# Patient Record
Sex: Female | Born: 1975 | Race: Black or African American | Hispanic: No | Marital: Single | Smoking: Former smoker
Health system: Southern US, Community
[De-identification: ages and names within clinical notes are randomized; demographics above are authoritative.]

## PROBLEM LIST (undated history)

## (undated) DIAGNOSIS — I10 Essential (primary) hypertension: Secondary | ICD-10-CM

## (undated) DIAGNOSIS — F32A Depression, unspecified: Secondary | ICD-10-CM

## (undated) DIAGNOSIS — F329 Major depressive disorder, single episode, unspecified: Secondary | ICD-10-CM

## (undated) DIAGNOSIS — F419 Anxiety disorder, unspecified: Secondary | ICD-10-CM

---

## 2007-11-24 HISTORY — PX: UTERINE FIBROID SURGERY: SHX826

## 2010-07-30 ENCOUNTER — Ambulatory Visit: Payer: Self-pay | Admitting: Psychiatry

## 2010-07-30 ENCOUNTER — Inpatient Hospital Stay (HOSPITAL_COMMUNITY): Admission: RE | Admit: 2010-07-30 | Discharge: 2010-08-03 | Payer: Self-pay | Admitting: Psychiatry

## 2011-02-05 LAB — COMPREHENSIVE METABOLIC PANEL
ALT: 15 U/L (ref 0–35)
Alkaline Phosphatase: 44 U/L (ref 39–117)
CO2: 24 mEq/L (ref 19–32)
GFR calc non Af Amer: >60 mL/min (ref >60)
Glucose, Bld: 91 mg/dL (ref 70–99)
Potassium: 4 mEq/L (ref 3.5–5.1)
Sodium: 139 mEq/L (ref 135–145)

## 2011-02-05 LAB — CBC
HCT: 43.2 % (ref 36.0–46.0)
Hemoglobin: 14.8 g/dL (ref 12.0–15.0)
RBC: 4.67 MIL/uL (ref 3.87–5.11)
WBC: 7.5 10*3/uL (ref 4.0–10.5)

## 2011-02-05 LAB — TSH: TSH: 1.762 u[IU]/mL (ref 0.350–4.500)

## 2011-05-28 ENCOUNTER — Emergency Department (HOSPITAL_COMMUNITY)
Admission: EM | Admit: 2011-05-28 | Discharge: 2011-05-29 | Discharge status: Against Medical Advice | Payer: Self-pay | Attending: Emergency Medicine | Admitting: Emergency Medicine

## 2011-05-28 DIAGNOSIS — R030 Elevated blood-pressure reading, without diagnosis of hypertension: Secondary | ICD-10-CM | POA: Insufficient documentation

## 2011-06-15 ENCOUNTER — Inpatient Hospital Stay (HOSPITAL_COMMUNITY)
Admission: RE | Admit: 2011-06-15 | Discharge: 2011-06-19 | DRG: 885 | Disposition: A | Discharge status: Home / Self Care | Payer: PRIVATE HEALTH INSURANCE | Attending: Psychiatry | Admitting: Psychiatry

## 2011-06-15 DIAGNOSIS — R45851 Suicidal ideations: Secondary | ICD-10-CM

## 2011-06-15 DIAGNOSIS — Z880 Allergy status to penicillin: Secondary | ICD-10-CM

## 2011-06-15 DIAGNOSIS — F3189 Other bipolar disorder: Principal | ICD-10-CM

## 2011-06-16 DIAGNOSIS — F3189 Other bipolar disorder: Secondary | ICD-10-CM

## 2011-06-16 LAB — COMPREHENSIVE METABOLIC PANEL
ALT: 19 U/L (ref 0–35)
BUN: 12 mg/dL (ref 6–23)
CO2: 25 mEq/L (ref 19–32)
Calcium: 9.9 mg/dL (ref 8.4–10.5)
GFR calc Af Amer: >60 mL/min (ref >60)
GFR calc non Af Amer: >60 mL/min (ref >60)
Glucose, Bld: 103 mg/dL — ABNORMAL HIGH (ref 70–99)
Sodium: 136 mEq/L (ref 135–145)

## 2011-06-16 LAB — CBC
HCT: 40.1 % (ref 36.0–46.0)
Hemoglobin: 13 g/dL (ref 12.0–15.0)
MCH: 29.2 pg (ref 26.0–34.0)
MCV: 90.1 fL (ref 78.0–100.0)
RBC: 4.45 MIL/uL (ref 3.87–5.11)

## 2011-06-16 LAB — DRUGS OF ABUSE SCREEN W/O ALC, ROUTINE URINE
Barbiturate Quant, Ur: NEGATIVE
Marijuana Metabolite: POSITIVE — AB
Methadone: NEGATIVE

## 2011-06-16 LAB — URINALYSIS, ROUTINE W REFLEX MICROSCOPIC
Bilirubin Urine: NEGATIVE
Protein, ur: NEGATIVE mg/dL
Urobilinogen, UA: 0.2 mg/dL (ref 0.0–1.0)

## 2011-06-16 LAB — LITHIUM LEVEL: Lithium Lvl: 1.04 mEq/L (ref 0.80–1.40)

## 2011-06-16 LAB — TSH: TSH: 4.198 u[IU]/mL (ref 0.350–4.500)

## 2011-06-16 LAB — URINE MICROSCOPIC-ADD ON

## 2011-06-17 LAB — T4, FREE: Free T4: 0.94 ng/dL (ref 0.80–1.80)

## 2011-06-17 LAB — T3, FREE: T3, Free: 2.9 pg/mL (ref 2.3–4.2)

## 2011-06-17 NOTE — Assessment & Plan Note (Signed)
NAME:  Heidi Baker, Heidi Baker             ACCOUNT NO.:  [id number]  MEDICAL RECORD NO.:  [id number]  LOCATION:  0502                          FACILITY:  [facility]  PHYSICIAN:  Franchot Gallo, MD     DATE OF BIRTH:  1976/04/21  DATE OF ADMISSION:  06/15/2011 DATE OF DISCHARGE:                      PSYCHIATRIC ADMISSION ASSESSMENT   This is a 35 year old female that was voluntarily admitted on June 15, 2011.  HISTORY OF PRESENT ILLNESS:  The patient presents with a history of depression and suicidal thoughts.  She reports states that "things have been building up," reporting problems with motivation, having increased anxiety. She is endorsing mood swings, has a past history of auditory  hallucinations but none currently.  She states her family had noticed a change in her behavior.  She has had a varied appetite with no significant weight loss. She had a plan to overdose.  PAST PSYCHIATRIC HISTORY:  The patient was here in September, was diagnosed with bipolar disorder at [facility] at [facility] mental health.  First diagnosed with bipolar in [place] in 2011. Patient's therapist is Bertram Millard.  SOCIAL HISTORY:  The patient is single.  She is in a relationship.  She is from [place], moved here in September.  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  FAMILY HISTORY:  None.  ALCOHOL AND DRUG HISTORY:  Denies any alcohol or substance use.  PRIMARY CARE PROVIDER:  None.  MEDICAL PROBLEMS:  Denies any acute or chronic health issues.  MEDICATIONS:  Has been on lithium carbonate 900 mg at bedtime, Saforis 10 mg at bedtime, and trazodone 200 mg at bedtime.  DRUG ALLERGIES:  AMOXICILLIN with the side effect of a rash.  REVIEW OF SYSTEMS:  CONSTITUTIONAL:  No fever, chills, positive for insomnia.  She wears glasses for both close and distant vision.  Denies any blurred or double vision.  History of headaches. Denies any seizures.  She denies any  nausea, vomiting.  GU:  No dysuria or frequency or hematuria.  No history of constipation.  EXTREMITIES:  No pain, positive for swelling in her feet.  Positive for depression.  Her temperature is 98.4, 74 heart rate, 16 respirations, blood pressure is 121/85.  PAST SURGICAL HISTORY:  Had a myomectomy in 2009.  She fractured her left wrist when she was 35 years old.  PHYSICAL EXAMINATION:  GENERAL APPEARANCE:  This is an overweight but well-nourished female, normally-developed appears in no distress. HEAD, NOSE AND THROAT:  Atraumatic.  EOMs are intact. NECK:  Supple.  Negative lymphadenopathy. CHEST:  Clear, no wheezes or rales. BREASTS:  Deferred. HEART:  Regular rate and rhythm.  No murmurs or gallops. ABDOMEN:  Soft, nontender abdomen.  Nondistended. PELVIC AND GU:  Exams deferred. EXTREMITIES:  Moves all extremities, 5+ against resistance.  No clubbing, no deformities. SKIN:  Warm and dry without rashes or lacerations. NEUROLOGICAL:  Findings are intact.  LABORATORY DATA:  Urinalysis is negative.  Lithium level 1.04, glucose of 103.  CBC within normal limits.  Urine pregnancy test is negative. Her TSH is in process.  MENTAL STATUS EXAM:  The patient was brought into the Interdisciplinary Team fully alert and cooperative, fair eye contact.  She is dressed in her own clothing, casually dressed.  Her speech is clear, normal pace and tone. Her mood is depressed.  She got tearful for a moment.  Thought process are coherent, goal-directed. Answers questions and offers good history of her symptoms. Cognitive function intact.  Her memory appears intact.  Judgment and insight appear to be good.  Axis I:  Bipolar II disorder, depressed. Axis II:  Deferred. Axis III:  No known medical conditions. Axis IV:  Other psychosocial problems were to chronic mental illness. Axis V:  Current is 37.  PLAN:  Continue with the patient's medications, admit for safety and stabilization.  Will  contact family for support.  Patient is in therapy and will continue to benefit from ongoing therapy.  We will also obtain further labs.  Put patient on a antidepressant with her depressive symptoms.     Landry Corporal, N.P.   ______________________________ Franchot Gallo, MD    JO/MEDQ  D:  06/16/2011  T:  06/17/2011  Job:  [id number]  Electronically Signed by Limmie PatriciaP. on 06/17/2011 01:17:22 PM Electronically Signed by Franchot Gallo MD on 06/17/2011 04:41:48 PM

## 2011-06-23 ENCOUNTER — Other Ambulatory Visit (HOSPITAL_COMMUNITY): Payer: Self-pay | Attending: Psychiatry | Admitting: Psychiatry

## 2011-06-23 NOTE — Discharge Summary (Signed)
  NAME:  Heidi Baker, WARDROP             ACCOUNT NO.:  [id number]  MEDICAL RECORD NO.:  [id number]  LOCATION:                                 FACILITY:  PHYSICIAN:  Franchot Gallo, MD     DATE OF BIRTH:  24-Oct-1976  DATE OF ADMISSION:  06/16/2011 DATE OF DISCHARGE:  06/19/2011                              DISCHARGE SUMMARY   REASON FOR ADMISSION:  This is a 35 year old female who was admitted for depression and suicidal thoughts, reported things have been building up, having problems with motivation, increased anxiety, mood swings.  Her appetite has been very varied, reporting no significant weight loss. She did have some suicidal thoughts with a plan to overdose.  FINAL IMPRESSION:  AXIS I:  Bipolar II disorder, depressed. AXIS II:  Deferred. AXIS III: No acute or chronic health issues. AXIS IV: Chronic mental illness. AXIS V: 70.  PERTINENT LABS:  Urinalysis was negative, lithium level 1.04, glucose of 103.  Her urine pregnancy test was negative.  SIGNIFICANT FINDINGS:  The patient was admitted to the adult milieu for safety and stabilization.  We reviewed her labs and obtained a TSH.  The patient was active in attending the groups.  She was able to articulate her needs and formulated discharge plan and goals.  She was reporting good sleep and good appetite, having mild depressive symptoms, rating it a 5-6 on a scale of 1-10.  She was having episodic suicidal thoughts, but no plan or intent.  Denied any homicidal thoughts or psychotic symptoms and showing no medication side effects.  We increased her Prozac to 40 mg to lessen her depressive symptoms.  She was showing no minimal side effects to her medications.  She was doing well, and on the day of discharge, the patient was reporting decreased sleep but preferred to be discharged and had this addressed by her outpatient provider.  She is having a good appetite, having mild depressive symptoms, rating it a 2-3 on a scale of  1-10.  She is adamantly denying any suicidal or homicidal thoughts or auditory hallucinations, showing mild anxiety, rating it a 2 on a scale of 1-10.  DISCHARGE MEDICATIONS: 1. Prozac 20 mg taking 2 daily. 2. Trazodone 100 mg taking 2 q.h.s. for sleep. 3. Lithium carbonate 300 mg taking 3 at bedtime. 4. Multivitamin daily. 5. Saphris 10 mg 1 q.h.s.  FOLLOWUP:  Her follow-up appointment was with [facility] at phone number [phone] and [facility] at phone number [phone] and had appointment at the [facility] outpatient IOP program at phone number [phone].     Landry Corporal, N.P.   ______________________________ Franchot Gallo, MD    JO/MEDQ  D:  06/22/2011  T:  06/22/2011  Job:  [id number]  Electronically Signed by Limmie PatriciaP. on 06/23/2011 11:56:20 AM Electronically Signed by Franchot Gallo MD on 06/23/2011 01:05:10 PM

## 2011-06-24 ENCOUNTER — Other Ambulatory Visit (HOSPITAL_COMMUNITY): Payer: Self-pay | Attending: Psychiatry | Admitting: Psychiatry

## 2011-06-24 DIAGNOSIS — F3189 Other bipolar disorder: Secondary | ICD-10-CM | POA: Insufficient documentation

## 2011-06-24 DIAGNOSIS — I10 Essential (primary) hypertension: Secondary | ICD-10-CM | POA: Insufficient documentation

## 2011-06-24 DIAGNOSIS — E669 Obesity, unspecified: Secondary | ICD-10-CM | POA: Insufficient documentation

## 2011-06-25 ENCOUNTER — Other Ambulatory Visit (HOSPITAL_BASED_OUTPATIENT_CLINIC_OR_DEPARTMENT_OTHER): Payer: Self-pay | Admitting: Psychiatry

## 2011-06-25 DIAGNOSIS — F3189 Other bipolar disorder: Secondary | ICD-10-CM

## 2011-06-26 ENCOUNTER — Other Ambulatory Visit (HOSPITAL_COMMUNITY): Payer: Self-pay | Admitting: Psychiatry

## 2011-06-29 ENCOUNTER — Other Ambulatory Visit (HOSPITAL_COMMUNITY): Payer: Self-pay | Admitting: Psychiatry

## 2011-06-30 ENCOUNTER — Other Ambulatory Visit (HOSPITAL_COMMUNITY): Payer: Self-pay | Admitting: Psychiatry

## 2011-07-01 ENCOUNTER — Other Ambulatory Visit (HOSPITAL_COMMUNITY): Payer: Self-pay | Admitting: Psychiatry

## 2011-07-02 ENCOUNTER — Other Ambulatory Visit (HOSPITAL_COMMUNITY): Payer: Self-pay | Admitting: Psychiatry

## 2011-07-03 ENCOUNTER — Other Ambulatory Visit (HOSPITAL_COMMUNITY): Payer: Self-pay | Admitting: Psychiatry

## 2011-07-06 ENCOUNTER — Other Ambulatory Visit (HOSPITAL_COMMUNITY): Payer: Self-pay | Admitting: Psychiatry

## 2011-07-07 ENCOUNTER — Other Ambulatory Visit (HOSPITAL_COMMUNITY): Payer: Self-pay | Admitting: Psychiatry

## 2011-07-08 ENCOUNTER — Other Ambulatory Visit (HOSPITAL_COMMUNITY): Payer: Self-pay | Admitting: Psychiatry

## 2011-07-09 ENCOUNTER — Other Ambulatory Visit (HOSPITAL_COMMUNITY): Payer: Self-pay | Admitting: Psychiatry

## 2011-07-10 ENCOUNTER — Other Ambulatory Visit (HOSPITAL_COMMUNITY): Payer: Self-pay | Admitting: Psychiatry

## 2011-07-13 ENCOUNTER — Other Ambulatory Visit (HOSPITAL_COMMUNITY): Payer: Self-pay | Admitting: Psychiatry

## 2011-07-14 ENCOUNTER — Other Ambulatory Visit (HOSPITAL_COMMUNITY): Payer: Self-pay | Admitting: Psychiatry

## 2011-07-15 ENCOUNTER — Other Ambulatory Visit (HOSPITAL_COMMUNITY): Payer: Self-pay | Admitting: Psychiatry

## 2011-07-16 ENCOUNTER — Other Ambulatory Visit (HOSPITAL_COMMUNITY): Payer: Self-pay | Admitting: Psychiatry

## 2011-07-17 ENCOUNTER — Other Ambulatory Visit (HOSPITAL_COMMUNITY): Payer: PRIVATE HEALTH INSURANCE | Admitting: Psychiatry

## 2011-07-17 ENCOUNTER — Other Ambulatory Visit (HOSPITAL_COMMUNITY): Payer: Self-pay | Admitting: Psychiatry

## 2011-07-17 ENCOUNTER — Ambulatory Visit (HOSPITAL_COMMUNITY)
Admission: RE | Admit: 2011-07-17 | Discharge: 2011-07-17 | Disposition: A | Discharge status: Home / Self Care | Payer: Self-pay | Attending: Psychiatry | Admitting: Psychiatry

## 2011-07-17 DIAGNOSIS — F1220 Cannabis dependence, uncomplicated: Secondary | ICD-10-CM | POA: Insufficient documentation

## 2011-07-20 ENCOUNTER — Other Ambulatory Visit (HOSPITAL_COMMUNITY): Payer: PRIVATE HEALTH INSURANCE | Attending: Psychiatry | Admitting: Psychiatry

## 2011-07-20 ENCOUNTER — Other Ambulatory Visit (HOSPITAL_COMMUNITY): Payer: Self-pay | Admitting: Psychiatry

## 2011-07-20 DIAGNOSIS — E669 Obesity, unspecified: Secondary | ICD-10-CM | POA: Insufficient documentation

## 2011-07-20 DIAGNOSIS — I10 Essential (primary) hypertension: Secondary | ICD-10-CM | POA: Insufficient documentation

## 2011-07-20 DIAGNOSIS — F3189 Other bipolar disorder: Secondary | ICD-10-CM | POA: Insufficient documentation

## 2011-07-20 DIAGNOSIS — R45851 Suicidal ideations: Secondary | ICD-10-CM | POA: Insufficient documentation

## 2011-07-22 ENCOUNTER — Other Ambulatory Visit (HOSPITAL_COMMUNITY): Payer: PRIVATE HEALTH INSURANCE | Admitting: Psychiatry

## 2011-07-24 ENCOUNTER — Other Ambulatory Visit (HOSPITAL_COMMUNITY): Payer: Self-pay | Admitting: Psychiatry

## 2011-07-24 ENCOUNTER — Other Ambulatory Visit (HOSPITAL_COMMUNITY): Payer: PRIVATE HEALTH INSURANCE | Admitting: Psychiatry

## 2011-07-25 LAB — URINE DRUGS OF ABUSE SCREEN W ALC, ROUTINE (REF LAB)
Barbiturate Quant, Ur: NEGATIVE
Benzodiazepines.: NEGATIVE
Ethyl Alcohol: <10 mg/dL (ref <10)
Opiate Screen, Urine: NEGATIVE
Phencyclidine (PCP): NEGATIVE
Propoxyphene: NEGATIVE

## 2011-07-28 ENCOUNTER — Other Ambulatory Visit (HOSPITAL_COMMUNITY): Payer: PRIVATE HEALTH INSURANCE | Attending: Physician Assistant

## 2011-07-28 DIAGNOSIS — F3189 Other bipolar disorder: Secondary | ICD-10-CM | POA: Insufficient documentation

## 2011-07-28 DIAGNOSIS — I10 Essential (primary) hypertension: Secondary | ICD-10-CM | POA: Insufficient documentation

## 2011-07-28 DIAGNOSIS — E669 Obesity, unspecified: Secondary | ICD-10-CM | POA: Insufficient documentation

## 2011-07-28 DIAGNOSIS — F1220 Cannabis dependence, uncomplicated: Secondary | ICD-10-CM | POA: Insufficient documentation

## 2011-07-29 ENCOUNTER — Other Ambulatory Visit (HOSPITAL_COMMUNITY): Payer: Self-pay | Admitting: Psychiatry

## 2011-07-29 ENCOUNTER — Other Ambulatory Visit (HOSPITAL_COMMUNITY): Payer: PRIVATE HEALTH INSURANCE | Attending: Psychiatry | Admitting: Psychiatry

## 2011-07-29 DIAGNOSIS — R45851 Suicidal ideations: Secondary | ICD-10-CM | POA: Insufficient documentation

## 2011-07-29 DIAGNOSIS — I10 Essential (primary) hypertension: Secondary | ICD-10-CM | POA: Insufficient documentation

## 2011-07-29 DIAGNOSIS — E669 Obesity, unspecified: Secondary | ICD-10-CM | POA: Insufficient documentation

## 2011-07-29 DIAGNOSIS — F3189 Other bipolar disorder: Secondary | ICD-10-CM | POA: Insufficient documentation

## 2011-07-30 LAB — URINE DRUGS OF ABUSE SCREEN W ALC, ROUTINE (REF LAB)
Barbiturate Quant, Ur: NEGATIVE
Benzodiazepines.: NEGATIVE
Ethyl Alcohol: <10 mg/dL (ref <10)
Phencyclidine (PCP): NEGATIVE
Propoxyphene: NEGATIVE

## 2011-07-31 ENCOUNTER — Other Ambulatory Visit (HOSPITAL_COMMUNITY): Payer: PRIVATE HEALTH INSURANCE | Admitting: Psychiatry

## 2011-08-03 ENCOUNTER — Other Ambulatory Visit (HOSPITAL_COMMUNITY): Payer: PRIVATE HEALTH INSURANCE | Admitting: Psychiatry

## 2011-08-04 ENCOUNTER — Other Ambulatory Visit (HOSPITAL_COMMUNITY): Payer: PRIVATE HEALTH INSURANCE

## 2011-08-05 ENCOUNTER — Other Ambulatory Visit (HOSPITAL_COMMUNITY): Payer: PRIVATE HEALTH INSURANCE | Admitting: Psychiatry

## 2011-08-07 ENCOUNTER — Other Ambulatory Visit (HOSPITAL_COMMUNITY): Payer: PRIVATE HEALTH INSURANCE | Admitting: Psychiatry

## 2011-08-10 ENCOUNTER — Other Ambulatory Visit (HOSPITAL_COMMUNITY): Payer: PRIVATE HEALTH INSURANCE | Admitting: Psychiatry

## 2011-08-12 ENCOUNTER — Other Ambulatory Visit (HOSPITAL_COMMUNITY): Payer: PRIVATE HEALTH INSURANCE | Admitting: Psychiatry

## 2011-08-12 ENCOUNTER — Other Ambulatory Visit (HOSPITAL_COMMUNITY): Payer: Self-pay | Admitting: Psychiatry

## 2011-08-13 LAB — URINE DRUGS OF ABUSE SCREEN W ALC, ROUTINE (REF LAB)
Barbiturate Quant, Ur: NEGATIVE
Benzodiazepines.: NEGATIVE
Marijuana Metabolite: NEGATIVE
Methadone: NEGATIVE
Opiate Screen, Urine: NEGATIVE
Propoxyphene: NEGATIVE

## 2011-08-14 ENCOUNTER — Other Ambulatory Visit (HOSPITAL_COMMUNITY): Payer: PRIVATE HEALTH INSURANCE | Admitting: Psychiatry

## 2011-08-17 ENCOUNTER — Other Ambulatory Visit (HOSPITAL_COMMUNITY): Payer: PRIVATE HEALTH INSURANCE | Admitting: Psychiatry

## 2011-08-19 ENCOUNTER — Encounter (INDEPENDENT_AMBULATORY_CARE_PROVIDER_SITE_OTHER): Payer: PRIVATE HEALTH INSURANCE | Admitting: Marriage and Family Therapist

## 2011-08-19 ENCOUNTER — Other Ambulatory Visit (HOSPITAL_COMMUNITY): Payer: PRIVATE HEALTH INSURANCE | Admitting: Psychiatry

## 2011-08-19 DIAGNOSIS — F4310 Post-traumatic stress disorder, unspecified: Secondary | ICD-10-CM

## 2011-08-19 DIAGNOSIS — F3160 Bipolar disorder, current episode mixed, unspecified: Secondary | ICD-10-CM

## 2011-08-21 ENCOUNTER — Other Ambulatory Visit (HOSPITAL_COMMUNITY): Payer: PRIVATE HEALTH INSURANCE | Admitting: Psychiatry

## 2011-08-24 ENCOUNTER — Other Ambulatory Visit (HOSPITAL_COMMUNITY): Payer: PRIVATE HEALTH INSURANCE | Admitting: Psychiatry

## 2011-08-25 ENCOUNTER — Encounter (INDEPENDENT_AMBULATORY_CARE_PROVIDER_SITE_OTHER): Payer: PRIVATE HEALTH INSURANCE | Admitting: Marriage and Family Therapist

## 2011-08-25 DIAGNOSIS — F3160 Bipolar disorder, current episode mixed, unspecified: Secondary | ICD-10-CM

## 2011-08-25 DIAGNOSIS — F4310 Post-traumatic stress disorder, unspecified: Secondary | ICD-10-CM

## 2011-08-26 ENCOUNTER — Other Ambulatory Visit (HOSPITAL_COMMUNITY): Payer: PRIVATE HEALTH INSURANCE | Admitting: Psychiatry

## 2011-08-28 ENCOUNTER — Other Ambulatory Visit (HOSPITAL_COMMUNITY): Payer: PRIVATE HEALTH INSURANCE | Admitting: Psychiatry

## 2011-08-31 ENCOUNTER — Other Ambulatory Visit (HOSPITAL_COMMUNITY): Payer: PRIVATE HEALTH INSURANCE | Admitting: Psychiatry

## 2011-08-31 ENCOUNTER — Encounter (INDEPENDENT_AMBULATORY_CARE_PROVIDER_SITE_OTHER): Payer: PRIVATE HEALTH INSURANCE | Admitting: Marriage and Family Therapist

## 2011-08-31 DIAGNOSIS — F4310 Post-traumatic stress disorder, unspecified: Secondary | ICD-10-CM

## 2011-08-31 DIAGNOSIS — F3160 Bipolar disorder, current episode mixed, unspecified: Secondary | ICD-10-CM

## 2011-09-02 ENCOUNTER — Other Ambulatory Visit (HOSPITAL_COMMUNITY): Payer: PRIVATE HEALTH INSURANCE | Admitting: Psychiatry

## 2011-09-04 ENCOUNTER — Other Ambulatory Visit (HOSPITAL_COMMUNITY): Payer: PRIVATE HEALTH INSURANCE | Admitting: Psychiatry

## 2011-09-07 ENCOUNTER — Other Ambulatory Visit (HOSPITAL_COMMUNITY): Payer: PRIVATE HEALTH INSURANCE | Admitting: Psychiatry

## 2011-09-08 ENCOUNTER — Encounter (HOSPITAL_COMMUNITY): Payer: PRIVATE HEALTH INSURANCE | Admitting: Marriage and Family Therapist

## 2011-09-09 ENCOUNTER — Other Ambulatory Visit (HOSPITAL_COMMUNITY): Payer: PRIVATE HEALTH INSURANCE | Admitting: Psychiatry

## 2011-09-11 ENCOUNTER — Other Ambulatory Visit (HOSPITAL_COMMUNITY): Payer: PRIVATE HEALTH INSURANCE | Admitting: Psychiatry

## 2011-09-14 ENCOUNTER — Other Ambulatory Visit (HOSPITAL_COMMUNITY): Payer: PRIVATE HEALTH INSURANCE | Admitting: Psychiatry

## 2011-09-15 ENCOUNTER — Encounter (HOSPITAL_COMMUNITY): Payer: PRIVATE HEALTH INSURANCE | Admitting: Marriage and Family Therapist

## 2011-09-15 ENCOUNTER — Encounter (INDEPENDENT_AMBULATORY_CARE_PROVIDER_SITE_OTHER): Payer: PRIVATE HEALTH INSURANCE | Admitting: Marriage and Family Therapist

## 2011-09-15 DIAGNOSIS — F3160 Bipolar disorder, current episode mixed, unspecified: Secondary | ICD-10-CM

## 2011-09-15 DIAGNOSIS — F4310 Post-traumatic stress disorder, unspecified: Secondary | ICD-10-CM

## 2011-09-16 ENCOUNTER — Inpatient Hospital Stay (INDEPENDENT_AMBULATORY_CARE_PROVIDER_SITE_OTHER)
Admission: RE | Admit: 2011-09-16 | Discharge: 2011-09-16 | Disposition: A | Discharge status: Home / Self Care | Payer: PRIVATE HEALTH INSURANCE | Source: Ambulatory Visit | Attending: Emergency Medicine | Admitting: Emergency Medicine

## 2011-09-16 ENCOUNTER — Other Ambulatory Visit (HOSPITAL_COMMUNITY): Payer: PRIVATE HEALTH INSURANCE | Admitting: Psychiatry

## 2011-09-16 DIAGNOSIS — N949 Unspecified condition associated with female genital organs and menstrual cycle: Secondary | ICD-10-CM

## 2011-09-16 LAB — POCT URINALYSIS DIP (DEVICE)
Bilirubin Urine: NEGATIVE
Glucose, UA: NEGATIVE mg/dL
Hgb urine dipstick: NEGATIVE
Ketones, ur: NEGATIVE mg/dL
Specific Gravity, Urine: 1.02 (ref 1.005–1.030)
pH: 7.5 (ref 5.0–8.0)

## 2011-09-16 LAB — WET PREP, GENITAL
Trich, Wet Prep: NONE SEEN
Yeast Wet Prep HPF POC: NONE SEEN

## 2011-09-17 LAB — GC/CHLAMYDIA PROBE AMP, GENITAL
Chlamydia, DNA Probe: NEGATIVE
GC Probe Amp, Genital: NEGATIVE

## 2011-09-18 ENCOUNTER — Other Ambulatory Visit (HOSPITAL_COMMUNITY): Payer: PRIVATE HEALTH INSURANCE | Admitting: Psychiatry

## 2011-09-21 ENCOUNTER — Other Ambulatory Visit (HOSPITAL_COMMUNITY): Payer: PRIVATE HEALTH INSURANCE | Admitting: Psychiatry

## 2011-09-22 ENCOUNTER — Encounter (INDEPENDENT_AMBULATORY_CARE_PROVIDER_SITE_OTHER): Payer: PRIVATE HEALTH INSURANCE | Admitting: Marriage and Family Therapist

## 2011-09-22 DIAGNOSIS — F4310 Post-traumatic stress disorder, unspecified: Secondary | ICD-10-CM

## 2011-09-22 DIAGNOSIS — F3160 Bipolar disorder, current episode mixed, unspecified: Secondary | ICD-10-CM

## 2011-09-23 ENCOUNTER — Other Ambulatory Visit (HOSPITAL_COMMUNITY): Payer: PRIVATE HEALTH INSURANCE | Admitting: Psychiatry

## 2011-09-29 ENCOUNTER — Encounter (HOSPITAL_COMMUNITY): Payer: PRIVATE HEALTH INSURANCE | Admitting: Marriage and Family Therapist

## 2011-10-07 ENCOUNTER — Telehealth (HOSPITAL_COMMUNITY): Payer: Self-pay | Admitting: Marriage and Family Therapist

## 2011-10-07 ENCOUNTER — Ambulatory Visit (INDEPENDENT_AMBULATORY_CARE_PROVIDER_SITE_OTHER): Payer: PRIVATE HEALTH INSURANCE | Admitting: Marriage and Family Therapist

## 2011-10-07 DIAGNOSIS — F3289 Other specified depressive episodes: Secondary | ICD-10-CM

## 2011-10-07 DIAGNOSIS — F329 Major depressive disorder, single episode, unspecified: Secondary | ICD-10-CM
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
## 2011-10-13 ENCOUNTER — Encounter (HOSPITAL_COMMUNITY): Payer: PRIVATE HEALTH INSURANCE | Admitting: Marriage and Family Therapist

## 2011-10-20 ENCOUNTER — Ambulatory Visit (HOSPITAL_COMMUNITY): Payer: PRIVATE HEALTH INSURANCE | Admitting: Marriage and Family Therapist

## 2011-10-20 NOTE — Progress Notes (Signed)
   THERAPIST PROGRESS NOTE  Session Time: 8:00 a.m.  Participation Level: Did Not Attend  Behavioral Response:   Type of Therapy: Individual Therapy  Treatment Goals addressed: Coping  Interventions: Other: None  Summary: Heidi Baker is a 36 y.o. female who presents with history of trauma and marijuana abuse.  Patient was asked to obtain a drug screen to ensure she is clean of marijuana.  She did not show for her appointment today stating she forgot she had an appointment.  She also reports she did not get her drug screen from several weeks ago.  Told patient that unless she gets the drug screen she is not allowed to attend sessions.  Patient agreed.  Suicidal/Homicidal: Nowithout intent/plan  Therapist Response: Patient can attend sessions when she completes her drug screen.  Plan: Return again in 2 weeks.  Diagnosis: Axis I: PTSD; marijuana dependence    Axis II: Deferred    Emberley Kral, LMFT 10/20/2011

## 2011-10-27 ENCOUNTER — Ambulatory Visit (HOSPITAL_COMMUNITY): Payer: PRIVATE HEALTH INSURANCE | Admitting: Marriage and Family Therapist

## 2011-11-10 ENCOUNTER — Ambulatory Visit (HOSPITAL_COMMUNITY): Payer: PRIVATE HEALTH INSURANCE | Admitting: Marriage and Family Therapist

## 2011-12-07 ENCOUNTER — Ambulatory Visit (HOSPITAL_COMMUNITY): Payer: PRIVATE HEALTH INSURANCE | Admitting: Marriage and Family Therapist

## 2011-12-14 ENCOUNTER — Ambulatory Visit (HOSPITAL_COMMUNITY): Payer: PRIVATE HEALTH INSURANCE | Admitting: Marriage and Family Therapist

## 2012-12-13 NOTE — Telephone Encounter (Signed)
No contact.

## 2013-06-09 ENCOUNTER — Other Ambulatory Visit (HOSPITAL_COMMUNITY): Payer: Self-pay | Admitting: Obstetrics & Gynecology

## 2013-06-09 DIAGNOSIS — IMO0002 Reserved for concepts with insufficient information to code with codable children: Secondary | ICD-10-CM

## 2013-06-16 ENCOUNTER — Ambulatory Visit (HOSPITAL_COMMUNITY)
Admission: RE | Admit: 2013-06-16 | Discharge: 2013-06-16 | Disposition: A | Discharge status: Home / Self Care | Payer: BC Managed Care – PPO | Source: Ambulatory Visit | Attending: Obstetrics & Gynecology | Admitting: Obstetrics & Gynecology

## 2013-06-16 DIAGNOSIS — N979 Female infertility, unspecified: Secondary | ICD-10-CM | POA: Insufficient documentation

## 2013-06-16 DIAGNOSIS — IMO0002 Reserved for concepts with insufficient information to code with codable children: Secondary | ICD-10-CM

## 2013-06-16 MED ORDER — IOHEXOL 300 MG/ML  SOLN
20.0000 mL | Freq: Once | INTRAMUSCULAR | Status: AC | PRN
  Administered 2013-06-16: 4 mL

## 2013-11-05 ENCOUNTER — Emergency Department (HOSPITAL_COMMUNITY)
Admission: EM | Admit: 2013-11-05 | Discharge: 2013-11-06 | Disposition: A | Discharge status: Other Acute Inpatient Hospital | Payer: BC Managed Care – PPO | Attending: Emergency Medicine | Admitting: Emergency Medicine

## 2013-11-05 DIAGNOSIS — Z3202 Encounter for pregnancy test, result negative: Secondary | ICD-10-CM | POA: Insufficient documentation

## 2013-11-05 DIAGNOSIS — F3289 Other specified depressive episodes: Secondary | ICD-10-CM | POA: Insufficient documentation

## 2013-11-05 DIAGNOSIS — F411 Generalized anxiety disorder: Secondary | ICD-10-CM | POA: Insufficient documentation

## 2013-11-05 DIAGNOSIS — F329 Major depressive disorder, single episode, unspecified: Secondary | ICD-10-CM | POA: Insufficient documentation

## 2013-11-05 DIAGNOSIS — Z0289 Encounter for other administrative examinations: Secondary | ICD-10-CM | POA: Insufficient documentation

## 2013-11-05 DIAGNOSIS — Z79899 Other long term (current) drug therapy: Secondary | ICD-10-CM | POA: Insufficient documentation

## 2013-11-05 DIAGNOSIS — R45851 Suicidal ideations: Secondary | ICD-10-CM

## 2013-11-05 DIAGNOSIS — Z87891 Personal history of nicotine dependence: Secondary | ICD-10-CM | POA: Insufficient documentation

## 2013-11-05 LAB — COMPREHENSIVE METABOLIC PANEL
ALT: 11 U/L (ref 0–35)
AST: 17 U/L (ref 0–37)
CO2: 23 mEq/L (ref 19–32)
Calcium: 9.3 mg/dL (ref 8.4–10.5)
Creatinine, Ser: 0.94 mg/dL (ref 0.50–1.10)
GFR calc Af Amer: >90 mL/min (ref >90)
GFR calc non Af Amer: 78 mL/min — ABNORMAL LOW (ref >90)
Sodium: 136 mEq/L (ref 135–145)
Total Protein: 6.8 g/dL (ref 6.0–8.3)

## 2013-11-05 LAB — CBC
MCH: 23.1 pg — ABNORMAL LOW (ref 26.0–34.0)
MCHC: 30.9 g/dL (ref 30.0–36.0)
MCV: 74.9 fL — ABNORMAL LOW (ref 78.0–100.0)
Platelets: 251 10*3/uL (ref 150–400)

## 2013-11-05 LAB — SALICYLATE LEVEL: Salicylate Lvl: <2 mg/dL — ABNORMAL LOW (ref 2.8–20.0)

## 2013-11-05 MED ORDER — LORAZEPAM 1 MG PO TABS: 1.0000 mg | ORAL_TABLET | Freq: Three times a day (TID) | ORAL | Status: DC | PRN

## 2013-11-05 MED ORDER — LITHIUM CARBONATE 300 MG PO CAPS
300.0000 mg | ORAL_CAPSULE | Freq: Three times a day (TID) | ORAL | Status: DC
  Administered 2013-11-06: 300 mg via ORAL
  Filled 2013-11-05: qty 1

## 2013-11-05 MED ORDER — ONDANSETRON HCL 4 MG PO TABS: 4.0000 mg | ORAL_TABLET | Freq: Three times a day (TID) | ORAL | Status: DC | PRN

## 2013-11-05 MED ORDER — ZIPRASIDONE MESYLATE 20 MG IM SOLR
INTRAMUSCULAR | Status: AC
  Filled 2013-11-05: qty 20

## 2013-11-05 MED ORDER — ZIPRASIDONE MESYLATE 20 MG IM SOLR
20.0000 mg | Freq: Once | INTRAMUSCULAR | Status: AC
  Administered 2013-11-05: 20 mg via INTRAMUSCULAR

## 2013-11-05 MED ORDER — ZOLPIDEM TARTRATE 5 MG PO TABS: 5.0000 mg | ORAL_TABLET | Freq: Every evening | ORAL | Status: DC | PRN

## 2013-11-05 MED ORDER — ACETAMINOPHEN 325 MG PO TABS: 650.0000 mg | ORAL_TABLET | ORAL | Status: DC | PRN

## 2013-11-05 NOTE — ED Notes (Signed)
Pt became very agitated, screaming and yelling,  Pt states if a gun was held to her head she would be fine and feel better,  Pt is refusing all care at this time,  Pt states she is going to lobby,  Pt has on blue scrubs but grabs her clothes out of the bag and them becomes more enraged when I tell her she cannot go to lobby,  Pt is pacing beating fist,  Glaring at me,  Security and [employer]  At bedside with assistance for medication injection.

## 2013-11-05 NOTE — ED Provider Notes (Signed)
CSN: [id number]     Arrival date & time 11/05/13  1916 History  This chart was scribed for Earley Favor, NP, working with Flint Melter, MD by Blanchard Kelch, ED Scribe. This patient was seen in room WTR4/[facility]PT4 and the patient's care was started at 8:34 PM.    Chief Complaint  Patient presents with  . Medical Clearance    The history is provided by the patient. No language interpreter was used.    HPI Comments: Heidi Baker is a 37 y.o. female who presents to the Emergency Department for a medical clearance. She has a past medical history of bipolar disorder and PTSD. She states she has had racing thoughts, anger and SI for the past few days. She states that her plan is a gun to her head, but she does not have access to a gun at home. She has been taking Ambien and Melatonin every time she wakes up for the past few days to help dull the thoughts. 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 Her therapist is Mr. Lorin Picket, but she has only seen him once about a week ago. They tried calling him but couldn't get in touch. Last week he told the patient he needed to get an appointment to change her drug therapy, but he can't change it unless he sees her multiple times. She has appointments for every Friday in January. She was last hospitalized for her psychiatric problems about a year ago. She has a job as a Runner, broadcasting/film/video and has been doing well with it for the past year.    No past medical history on file. No past surgical history on file. No family history on file. History  Substance Use Topics  . Smoking status: Not on file  . Smokeless tobacco: Not on file  . Alcohol Use: Not on file   OB History   No data available     Review of Systems  Constitutional: Negative for fever.  Psychiatric/Behavioral: Positive for suicidal ideas and dysphoric mood. The patient is nervous/anxious.   All other systems reviewed and are negative.    Allergies   Amoxicillin  Home Medications   Current Outpatient Rx  Name  Route  Sig  Dispense  Refill  . lithium carbonate 300 MG capsule   Oral   Take 300 mg by mouth 3 (three) times daily with meals.         . Melatonin 1 MG TABS   Oral   Take 1 tablet by mouth daily.         Marland Kitchen zolpidem (AMBIEN) 10 MG tablet   Oral   Take 10 mg by mouth at bedtime as needed for sleep.          Triage Vitals: BP 121/75  Pulse 75  Temp(Src) 98.3 F (36.8 C) (Oral)  Resp 15  Ht 5\' 6"  (1.676 m)  Wt 193 lb (87.544 kg)  BMI 31.17 kg/m2  SpO2 99%  LMP 10/23/2013  Physical Exam  Nursing note and vitals reviewed. Constitutional: She is oriented to person, place, and time. She appears well-developed and well-nourished. No distress.  HENT:  Head: Normocephalic and atraumatic.  Eyes: EOM are normal.  Neck: Normal range of motion. Neck supple.  Cardiovascular: Normal rate and regular rhythm.   Pulmonary/Chest: Effort normal. No respiratory distress.  Musculoskeletal: Normal range of motion.  Neurological: She is alert and oriented to person, place, and time.  Skin: Skin is warm and dry.  Psychiatric: Her speech is normal and behavior is normal. Her mood appears anxious.    ED Course  Procedures (including critical care time)  DIAGNOSTIC STUDIES: Oxygen Saturation is 99% on room air, normal by my interpretation.    COORDINATION OF CARE: 8:40 PM -Will order labs. Patient verbalizes understanding and agrees with treatment plan.    Labs Review Labs Reviewed - No data to display Imaging Review No results found.  EKG Interpretation   None       MDM  No diagnosis found. I discussed the evaluation process with the patient and her husband  I personally performed the services described in this documentation, which was scribed in my presence. The recorded information has been reviewed and is accurate.   Arman Filter, NP 11/05/13 [phone]

## 2013-11-05 NOTE — ED Notes (Signed)
Bed: WLPT4 Expected date:  Expected time:  Means of arrival:  Comments: 
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
## 2013-11-05 NOTE — ED Notes (Signed)
Pt arrived with a need for medical clearance.  Pt states she has bi[polar and PTSD and is on  Medication regiment , to which she has been compliant but is still having excessive stress.

## 2013-11-05 NOTE — ED Provider Notes (Signed)
Medical screening examination/treatment/procedure(s) were performed by non-physician practitioner and as supervising physician I was immediately available for consultation/collaboration.  Flint Melter, MD 11/05/13 [phone]

## 2013-11-06 ENCOUNTER — Inpatient Hospital Stay (HOSPITAL_COMMUNITY)
Admission: AD | Admit: 2013-11-06 | Discharge: 2013-11-13 | DRG: 885 | Disposition: A | Discharge status: Home / Self Care | Payer: BC Managed Care – PPO | Source: Intra-hospital | Attending: Psychiatry | Admitting: Psychiatry

## 2013-11-06 ENCOUNTER — Encounter (HOSPITAL_COMMUNITY): Payer: Self-pay | Admitting: *Deleted

## 2013-11-06 DIAGNOSIS — F1210 Cannabis abuse, uncomplicated: Secondary | ICD-10-CM | POA: Diagnosis present

## 2013-11-06 DIAGNOSIS — F4310 Post-traumatic stress disorder, unspecified: Secondary | ICD-10-CM | POA: Diagnosis present

## 2013-11-06 DIAGNOSIS — F1010 Alcohol abuse, uncomplicated: Secondary | ICD-10-CM

## 2013-11-06 DIAGNOSIS — F3110 Bipolar disorder, current episode manic without psychotic features, unspecified: Principal | ICD-10-CM | POA: Diagnosis present

## 2013-11-06 DIAGNOSIS — F411 Generalized anxiety disorder: Secondary | ICD-10-CM | POA: Diagnosis present

## 2013-11-06 DIAGNOSIS — Z79899 Other long term (current) drug therapy: Secondary | ICD-10-CM

## 2013-11-06 DIAGNOSIS — R45851 Suicidal ideations: Secondary | ICD-10-CM

## 2013-11-06 HISTORY — DX: Anxiety disorder, unspecified: F41.9

## 2013-11-06 HISTORY — DX: Depression, unspecified: F32.A

## 2013-11-06 HISTORY — DX: Major depressive disorder, single episode, unspecified: F32.9

## 2013-11-06 LAB — RAPID URINE DRUG SCREEN, HOSP PERFORMED
Amphetamines: NOT DETECTED
Cocaine: NOT DETECTED
Opiates: NOT DETECTED
Tetrahydrocannabinol: POSITIVE — AB

## 2013-11-06 LAB — POCT PREGNANCY, URINE: Preg Test, Ur: NEGATIVE

## 2013-11-06 MED ORDER — ACETAMINOPHEN 325 MG PO TABS
650.0000 mg | ORAL_TABLET | Freq: Four times a day (QID) | ORAL | Status: DC | PRN
  Administered 2013-11-07: 650 mg via ORAL
  Filled 2013-11-06: qty 2

## 2013-11-06 MED ORDER — MAGNESIUM HYDROXIDE 400 MG/5ML PO SUSP: 30.0000 mL | Freq: Every day | ORAL | Status: DC | PRN

## 2013-11-06 MED ORDER — ALUM & MAG HYDROXIDE-SIMETH 200-200-20 MG/5ML PO SUSP: 30.0000 mL | ORAL | Status: DC | PRN

## 2013-11-06 MED ORDER — LITHIUM CARBONATE 300 MG PO CAPS
300.0000 mg | ORAL_CAPSULE | Freq: Three times a day (TID) | ORAL | Status: DC
  Administered 2013-11-06 – 2013-11-08 (×6): 300 mg via ORAL
  Filled 2013-11-06 (×13): qty 1

## 2013-11-06 MED ORDER — TRAZODONE HCL 50 MG PO TABS
50.0000 mg | ORAL_TABLET | Freq: Every evening | ORAL | Status: DC | PRN
  Administered 2013-11-06: 50 mg via ORAL
  Filled 2013-11-06: qty 1

## 2013-11-06 NOTE — Progress Notes (Signed)
Recreation Therapy Notes  Date: 12.15.2014 Time: 3:00pm Location: [address] Dayroom   Group Topic: Communication, Team Building, Problem Solving  Goal Area(s) Addresses:  Patient will effectively work with peer towards shared goal.  Patient will identify skill used to make activity successful.  Patient will identify how skills used during activity can be used to reach post d/c goals.   Behavioral Response: Appropriate   Intervention: Problem Solving Activitiy  Activity: Life Boat. Patients were given a scenario about being on a sinking yacht. Patients were informed the yacht included 15 guest, 8 of which could be placed on the life boat, along with all group members. Individuals on guest list were of varying socioeconomic classes such as a Education officer, museum, Materials engineer, Midwife, Tree surgeon.   Education: Pharmacist, community, Discharge Planning    Education Outcome: Acknowledges understanding  Clinical Observations/Feedback: Patient with peers were slow to engage in activity, needing encouragement to participate in group session. Following encouragement patient actively engaged in group session, voicing her opinion and debating with peers appropriately. Patient made no additional contributions to group discussion, but appeared to actively listen, as she maintained appropriate eye contact with speaker.    Heidi Baker Heidi Baker, LRT/CTRS  Jearl Klinefelter 11/06/2013 3:51 PM

## 2013-11-06 NOTE — Progress Notes (Signed)
Patient ID: Heidi Baker, female   DOB: [birth year], 37 y.o.   MRN: [id number]  Patient less agitated after receiving Geodon injection. Patient has been accepted to [facility], Bed 503-2.
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
## 2013-11-06 NOTE — [facility] Counselor (Signed)
Per [facility] RN, pt can be transferred after 10:30 am.  Evette Cristal, [facility] Assessment Counselor

## 2013-11-06 NOTE — Progress Notes (Signed)
D: Patient in the dayroom on approach.  Patient states she had an ok day today.  Patient states since she has been at [facility] her experience has been better.  Patient states she is concerned about her medications and states she just wants to feel better.  Patient states she has a lot to work on.  Patient denies SI.HI and denies AVH.  A: Staff to monitor Q 15 mins for safety.  Encouragement and support offered.  No scheduled medications administered per orders.  Trazodone administered prn for sleep. R: Patient remains safe on the unit.  Patient attended group tonight.  Patient visible on the unit and interacting with peers.

## 2013-11-06 NOTE — Tx Team (Signed)
Initial Interdisciplinary Treatment Plan  PATIENT STRENGTHS: (choose at least two) Ability for insight Average or above average intelligence Capable of independent living Communication skills General fund of knowledge Motivation for treatment/growth Supportive family/friends Work skills  PATIENT STRESSORS: Marital or family conflict Medication change or noncompliance Occupational concerns Substance abuse   PROBLEM LIST: Problem List/Patient Goals Date to be addressed Date deferred Reason deferred Estimated date of resolution  Substance abuse 11/06/2013   D/c        Suicidal ideation 11/06/2013   D/c        depression 11/06/2013   D/c                           DISCHARGE CRITERIA:  Ability to meet basic life and health needs Adequate post-discharge living arrangements Improved stabilization in mood, thinking, and/or behavior Medical problems require only outpatient monitoring Motivation to continue treatment in a less acute level of care Need for constant or close observation no longer present Reduction of life-threatening or endangering symptoms to within safe limits Safe-care adequate arrangements made Verbal commitment to aftercare and medication compliance Withdrawal symptoms are absent or subacute and managed without 24-hour nursing intervention  PRELIMINARY DISCHARGE PLAN: Attend aftercare/continuing care group Attend PHP/IOP Attend 12-step recovery group Outpatient therapy Participate in family therapy Return to previous living arrangement Return to previous work or school arrangements  PATIENT/FAMIILY INVOLVEMENT: This treatment plan has been presented to and reviewed with the patient, Heidi Baker.  The patient and family have been given the opportunity to ask questions and make suggestions.  Heidi Baker 11/06/2013, 1:43 PM
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
## 2013-11-07 DIAGNOSIS — F1994 Other psychoactive substance use, unspecified with psychoactive substance-induced mood disorder: Secondary | ICD-10-CM

## 2013-11-07 DIAGNOSIS — F1210 Cannabis abuse, uncomplicated: Secondary | ICD-10-CM | POA: Diagnosis present

## 2013-11-07 DIAGNOSIS — F1910 Other psychoactive substance abuse, uncomplicated: Secondary | ICD-10-CM

## 2013-11-07 DIAGNOSIS — F3110 Bipolar disorder, current episode manic without psychotic features, unspecified: Secondary | ICD-10-CM | POA: Diagnosis present

## 2013-11-07 DIAGNOSIS — F3160 Bipolar disorder, current episode mixed, unspecified: Secondary | ICD-10-CM

## 2013-11-07 MED ORDER — TRAZODONE HCL 50 MG PO TABS
50.0000 mg | ORAL_TABLET | Freq: Every evening | ORAL | Status: DC | PRN
  Administered 2013-11-07 – 2013-11-12 (×5): 50 mg via ORAL
  Filled 2013-11-07 (×5): qty 1

## 2013-11-07 MED ORDER — MELATONIN 5 MG PO TABS
5.0000 mg | ORAL_TABLET | Freq: Every day | ORAL | Status: DC
  Administered 2013-11-07 – 2013-11-08 (×2): 5 mg via ORAL

## 2013-11-07 MED ORDER — NON FORMULARY: 100.0000 mg | Freq: Every day | Status: DC

## 2013-11-07 MED ORDER — MELATONIN 1 MG PO TABS
1.0000 mg | ORAL_TABLET | Freq: Every day | ORAL | Status: DC
Start: 2013-11-07 — End: 2013-11-07
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
## 2013-11-07 NOTE — Progress Notes (Signed)
Adult Psychoeducational Group Note  Date:  11/07/2013 Time:  2:32 AM  Group Topic/Focus:  Wrap-Up Group:   The focus of this group is to help patients review their daily goal of treatment and discuss progress on daily workbooks.  Participation Level:  Active  Participation Quality:  Appropriate  Affect:  Appropriate  Cognitive:  Appropriate  Insight: Appropriate  Engagement in Group:  Engaged  Modes of Intervention:  Support  Additional Comments:Pt stated that she had a great group earlier today and that the example, visualization, given was very powerful  Areana Kosanke 11/07/2013, 2:32 AM

## 2013-11-07 NOTE — Progress Notes (Signed)
The focus of this group is to educate the patient on the purpose and policies of crisis stabilization and provide a format to answer questions about their admission.  The group details unit policies and expectations of patients while admitted.  Patient did not attend group. 

## 2013-11-07 NOTE — Progress Notes (Signed)
D: Patient in the dayroom interacting with peers on approach.  Patient states she had a good day today.  Patient states her fiance was able to come visit her several times today.  Patient animated and engages in conversation with Clinical research associate.  Patient states this experience has made her realize she has some things she needs to change.  Patient   Patient denies SI/HI and denies AVH. A: Staff to monitor Q 15 mins for safety.  Encouragement and support offered.  Scheduled medications administered per orders. R: Patient remains safe on the unit.  Patient attended group tonight.  Patient visible on the unit and interacting with peers.  Patient taking administered medications.
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
## 2013-11-07 NOTE — Progress Notes (Signed)
D: Patient denies SI/HI and A/V hallucinations; patient reports sleep is poor; reports appetite is poor; reports energy level is low ; reports ability to pay attention is improving; rates depression as 6-7/10; rates hopelessness 8/10;   A: Monitored q 15 minutes; patient encouraged to attend groups; patient educated about medications; patient given medications per physician orders; patient encouraged to express feelings and/or concerns  R: Patient is flat and blunted; patient does have some insight into her situation; patient will elaborate and appears to be invested; patient's interaction with staff and peers is approrpiate; patient was able to set goal to talk with staff 1:1 when having feelings of SI; patient is taking medications as prescribed and tolerating medications; patient is attending all groups and engaging

## 2013-11-07 NOTE — Progress Notes (Signed)
Adult Psychoeducational Group Note  Date:  11/07/2013 Time:  11:00am  Group Topic/Focus:  Recovery Goals:   The focus of this group is to identify appropriate goals for recovery and establish a plan to achieve them.  Participation Level:  Active  Participation Quality:  Appropriate and Attentive  Affect:  Appropriate  Cognitive:  Alert and Appropriate  Insight: Appropriate  Engagement in Group:  Engaged  Modes of Intervention:  Discussion and Education  Additional Comments:  Pt attended and participated in group. Discussion was on recovery and what it means to you. Recovery means getting better.  Shelly Bombard D 11/07/2013, 5:19 PM

## 2013-11-07 NOTE — [facility] Group Notes (Addendum)
[facility] LCSW Group Therapy      Feelings About Diagnosis 1:15 - 2:30 PM         11/07/2013  3:15 PM   Type of Therapy:  Group Therapy  Participation Level:  Active  Participation Quality:  Appropriate  Affect:  Appropriate  Cognitive:  Alert and Appropriate  Insight:  Developing/Improving and Engaged  Engagement in Therapy:  Developing/Improving and Engaged  Modes of Intervention:  Discussion, Education, Exploration, Problem-Solving, Rapport Building, Support  Summary of Progress/Problems:  Patient actively participated in group. Patient discussed past and present diagnosis and the effects it has had on  life.  Patient talked about family and society being judgmental and the stigma associated with having a mental health diagnosis.  Patient shared she is able to accept her illness and who she is whether depressed or manic.  Patient shared she works to be the best "Cally" she can be.  Wynn Banker 11/07/2013  3:15 PM
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
## 2013-11-07 NOTE — BHH Suicide Risk Assessment (Signed)
[facility] INPATIENT:  Family/Significant Other Suicide Prevention Education  Suicide Prevention Education:  Education Completed; [facility] [place], [place], [phone]; has been identified by the patient as the family member/significant other with whom the patient will be residing, and identified as the person(s) who will aid the patient in the event of a mental health crisis (suicidal ideations/suicide attempt).  With written consent from the patient, the family member/significant other has been provided the following suicide prevention education, prior to the and/or following the discharge of the patient.  The suicide prevention education provided includes the following:  Suicide risk factors  Suicide prevention and interventions  National Suicide Hotline telephone number  [facility] assessment telephone number  [facility] Emergency Assistance 911  [facility] and/or Residential Mobile Crisis Unit telephone number  Request made of family/significant other to:  Remove weapons (e.g., guns, rifles, knives), all items previously/currently identified as safety concern.  Family member to secure guns prior to patient discharging.    Remove drugs/medications (over-the-counter, prescriptions, illicit drugs), all items previously/currently identified as a safety concern.  The family member/significant other verbalizes understanding of the suicide prevention education information provided.  The family member/significant other agrees to remove the items of safety concern listed above.  Wynn Banker 11/07/2013, 11:06 AM

## 2013-11-08 MED ORDER — LITHIUM CARBONATE 300 MG PO CAPS
1200.0000 mg | ORAL_CAPSULE | Freq: Every day | ORAL | Status: DC
  Administered 2013-11-09 – 2013-11-12 (×4): 1200 mg via ORAL
  Filled 2013-11-08 (×6): qty 4

## 2013-11-08 MED ORDER — DESOGESTREL-ETHINYL ESTRADIOL 0.15-30 MG-MCG PO TABS: 1.0000 | ORAL_TABLET | Freq: Every day | ORAL | Status: DC

## 2013-11-08 MED ORDER — LITHIUM CARBONATE 300 MG PO CAPS
900.0000 mg | ORAL_CAPSULE | Freq: Once | ORAL | Status: AC
  Administered 2013-11-08: 900 mg via ORAL
  Filled 2013-11-08 (×2): qty 3

## 2013-11-08 NOTE — Progress Notes (Signed)
Adult Psychoeducational Group Note  Date:  11/08/2013 Time:  11:00am Group Topic/Focus:  Personal Choices and Values:   The focus of this group is to help patients assess and explore the importance of values in their lives, how their values affect their decisions, how they express their values and what opposes their expression.  Participation Level:  Active  Participation Quality:  Appropriate and Attentive  Affect:  Appropriate  Cognitive:  Alert and Appropriate  Insight: Appropriate  Engagement in Group:  Engaged  Modes of Intervention:  Discussion and Education  Additional Comments:  Pt attended and participated in group. Discussion was on personal development. Pt stated personal development means self evaluation and put plans into actions.  Shelly Bombard D 11/08/2013, 12:54 PM

## 2013-11-08 NOTE — Progress Notes (Signed)
Recreation Therapy Notes  Date: 12.17.2014 Time: 3:00pm Location: [address] Dayroom   Group Topic: Communication, Team Building.   Goal Area(s) Addresses:  Patient will effectively work with peer towards shared goal.  Patient will identify skill used to make activity successful.  Patient will identify how skills used during activity can be used to reach post d/c goals.   Behavioral Response: Appropraite  Intervention: Survival Scenario  Activity: Space Survival. Patients were given a scenario about being lost in space and a list of supplies they were able to salvage. Patients were asked to individually rank items and then rank items as a group.   Education: Pharmacist, community, Building control surveyor.    Education Outcome: Needs additional education   Clinical Observations/Feedback: Patient was actively engaged in activity, assisting peer that arrived late and completing her individual ranking. When group transitioned into group ranking of salvaged items and group discussion patient leaned head on chair back and had no further involvement in group session.    Marykay Lex Shakora Nordquist, LRT/CTRS  Jearl Klinefelter 11/08/2013 4:56 PM

## 2013-11-08 NOTE — Progress Notes (Signed)
Adult Psychoeducational Group Note  Date:  11/08/2013 Time:  11:00am Group Topic/Focus:  Personal Choices and Values:   The focus of this group is to help patients assess and explore the importance of values in their lives, how their values affect their decisions, how they express their values and what opposes their expression.  Participation Level:  Active  Participation Quality:  Appropriate and Attentive  Affect:  Appropriate  Cognitive:  Alert and Appropriate  Insight: Appropriate  Engagement in Group:  Engaged  Modes of Intervention:  Discussion and Education  Additional Comments:  Pt attended and participated in group. Discussion was on personal development. Pt stated Personal development means self evaluation and putting plans into action.   Shelly Bombard D 11/08/2013, 4:27 PM

## 2013-11-08 NOTE — [facility] Group Notes (Signed)
[facility] LCSW Aftercare Discharge Planning Group Note   11/08/2013 10:48 AM    Participation Quality:  Appropraite  Mood/Affect:  Appropriate  Depression Rating:  2  Anxiety Rating:  1  Thoughts of Suicide:  No  Will you contract for safety?   NA  Current AVH:  No  Plan for Discharge/Comments:  Patient attended discharge planning group and actively participated in group. She reports being a little better today.  Patient advised of having outpatient follow up with [facility].   provided all participants with daily workbook.   Transportation Means: Patient has transportation.   Supports:  Patient has a support system.   Heidi Baker, Heidi Baker
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
## 2013-11-08 NOTE — [facility] Group Notes (Signed)
Adult Psychoeducational Group Note  Date:  11/08/2013 Time:  12:15 AM  Group Topic/Focus:  Wrap-Up Group:   The focus of this group is to help patients review their daily goal of treatment and discuss progress on daily workbooks.  Participation Level:  Active  Participation Quality:  Appropriate  Affect:  Appropriate  Cognitive:  Appropriate  Insight: Appropriate  Engagement in Group:  Engaged  Modes of Intervention:  Discussion  Additional Comments:  Patient stated she had a good day.  Estevan Oaks Shaunte 11/08/2013, 12:15 AM

## 2013-11-08 NOTE — Progress Notes (Signed)
NUTRITION ASSESSMENT  Pt identified as at risk on the Malnutrition Screen Tool  INTERVENTION: 1. Educated patient on the importance of nutrition and encouraged intake of food and beverages.--discussed high iron choices things that can decrease and increase iron absorption.  Discussed heathy weight loss. 2. Discussed weight goals. 3. Supplements: none  NUTRITION DIAGNOSIS: Unintentional weight loss related to sub-optimal intake as evidenced by pt report.   Goal: Pt to meet >/= 90% of their estimated nutrition needs.  Monitor:  PO intake  Assessment:  Patient admitted with SI and PTSD.  Bipolar, manic, canibus abuse.    H/H are low.  Per chart, patient with a 150 lb weight loss in the past year.  (349 lbs in October of 2013.)  States that she lost weight by following a more healthy unprocessed diet with more fruits and vegetables and lean meats.  Depression also played a part.  Diet hx prior to admit obtained and patient with overall good intake when she is able to eat.    37 y.o. female  Height: Ht Readings from Last 1 Encounters:  11/06/13 5\' 6"  (1.676 m)    Weight: Wt Readings from Last 1 Encounters:  11/06/13 193 lb (87.544 kg)    Weight Hx: Wt Readings from Last 10 Encounters:  11/06/13 193 lb (87.544 kg)  11/05/13 193 lb (87.544 kg)    BMI:  Body mass index is 31.17 kg/(m^2). Pt meets criteria for obesity grade 1 based on current BMI.  Estimated Nutritional Needs: Kcal: 25-30 kcal/kg Protein: > 1 gram protein/kg Fluid: 1 ml/kcal  Diet Order: General Pt is also offered choice of unit snacks mid-morning and mid-afternoon.  Pt is eating as desired.   Lab results and medications reviewed.   Oran Rein, RD, LDN Clinical Inpatient Dietitian Pager:  [phone] Weekend and after hours pager:  [phone]

## 2013-11-08 NOTE — Progress Notes (Signed)
D: Patient denies SI/HI and A/V hallucinations; patient reports sleep is poor; reports appetite is improving ; reports energy level is high ; reports ability to pay attention is improving; rates depression as 3/10; rates hopelessness 2/10;   A: Monitored q 15 minutes; patient encouraged to attend groups; patient educated about medications; patient given medications per physician orders; patient encouraged to express feelings and/or concerns  R: Patient is invested with her treatment.; patient reports that she would like to make a wellness binder that contains useful information for her support system; patient is cooperative; patient stated she was triggered when the officer came by to bring the involuntary papers; the information she reports did not bother her but the sight of the officer from situations in her past triggered something; patient was able to calm her self down but she want to learn more skills to cope with the symptoms of PTSD; patient's interaction with staff and peers is appropriate; patient was able to set goal to talk with staff 1:1 when having feelings of SI; patient is taking medications as prescribed and tolerating medications; patient is attending all groups and engaging and able to voice things she has learned

## 2013-11-08 NOTE — Progress Notes (Signed)
Patient ID: Heidi Baker, female   DOB: [birth year], 37 y.o.   MRN: [id number] [facility] MD Progress Note  11/08/2013 1:47 PM Kaytlen Lightsey  MRN:  [id number] Subjective:  Met with Donata Clay today and she states she is doing OK. She is not sleeping well. We also discussed her medication and she is given her lab results for her lithium level. She states she is compliant with her medication and always comes in a "sub therapeutic."  We also did discuss her marijuana usage and she is wanting to stop. Diagnosis:   DSM5: Schizophrenia Disorders:   Obsessive-Compulsive Disorders:   Trauma-Stressor Disorders:  Posttraumatic Stress Disorder (309.81) Substance/Addictive Disorders:   Depressive Disorders:  Major Depressive Disorder - Severe (296.23)  Axis I: Bipolar, mixed, Post Traumatic Stress Disorder, Substance Abuse and Substance Induced Mood Disorder  ADL's:  Intact  Sleep: Poor  Appetite:  Poor  Suicidal Ideation:  Patient has suicidal ideations, intentions and plans to contract for safety in the hospital Homicidal Ideation:  Denied AEB (as evidenced by):  Psychiatric Specialty Exam: ROS  Blood pressure 121/91, pulse 90, temperature 97.9 F (36.6 C), temperature source Oral, resp. rate 16, height 5\' 6"  (1.676 m), weight 87.544 kg (193 lb), last menstrual period 10/23/2013, SpO2 100.00%.Body mass index is 31.17 kg/(m^2).  General Appearance: Guarded  Eye Contact::  Minimal  Speech:  Normal Rate  Volume:  Normal  Mood:  Angry, Anxious, Depressed, Hopeless and Worthless  Affect:  Labile  Thought Process:  Goal Directed and Intact  Orientation:  Full (Time, Place, and Person)  Thought Content:  Rumination  Suicidal Thoughts:  Yes.  with intent/plan  Homicidal Thoughts:  No  Memory:  Immediate;   Fair  Judgement:  Intact  Insight:  Fair  Psychomotor Activity:  Decreased  Concentration:  Fair  Recall:  Fair  Akathisia:  NA  Handed:  Right  AIMS (if indicated):     Assets:   Communication Skills Desire for Improvement Financial Resources/Insurance Housing Intimacy Physical Health Resilience Social Support Transportation Vocational/Educational  Sleep:  Number of Hours: 3.75   Current Medications: Current Facility-Administered Medications  Medication Dose Route Frequency Provider Last Rate Last Dose  . acetaminophen (TYLENOL) tablet 650 mg  650 mg Oral Q6H PRN Kristeen Mans, NP   650 mg at 11/07/13 1024  . alum & mag hydroxide-simeth (MAALOX/MYLANTA) 200-200-20 MG/5ML suspension 30 mL  30 mL Oral Q4H PRN Kristeen Mans, NP      . lithium carbonate capsule 300 mg  300 mg Oral TID WC Kristeen Mans, NP   300 mg at 11/08/13 1156  . magnesium hydroxide (MILK OF MAGNESIA) suspension 30 mL  30 mL Oral Daily PRN Kristeen Mans, NP      . Melatonin TABS 5 mg  5 mg Oral QHS Nehemiah Settle, MD   5 mg at 11/07/13 2200  . traZODone (DESYREL) tablet 50 mg  50 mg Oral QHS PRN,MR X 1 Kerry Hough, PA-C   50 mg at 11/07/13 0119    Lab Results:  Results for orders placed during the hospital encounter of 11/06/13 (from the past 48 hour(s))  LITHIUM LEVEL     Status: Abnormal   Collection Time    11/08/13  6:15 AM      Result Value Range   Lithium Lvl 0.36 (*) 0.80 - 1.40 mEq/L   Comment: Performed at [facility]    Physical Findings: AIMS: Facial and Oral Movements Muscles of Facial Expression:  None, normal Lips and Perioral Area: None, normal Jaw: None, normal Tongue: None, normal,Extremity Movements Upper (arms, wrists, hands, fingers): None, normal Lower (legs, knees, ankles, toes): None, normal, Trunk Movements Neck, shoulders, hips: None, normal, Overall Severity Severity of abnormal movements (highest score from questions above): None, normal Incapacitation due to abnormal movements: None, normal Patient's awareness of abnormal movements (rate only patient's report): No Awareness, Dental Status Current problems with teeth  and/or dentures?: No Does patient usually wear dentures?: No  CIWA:  CIWA-Ar Total: 4 COWS:  COWS Total Score: 2  Treatment Plan Summary: Daily contact with patient to assess and evaluate symptoms and progress in treatment Medication management  Plan: Restart home medications and adjust as clinically required Treatment Plan/Recommendations:  1. After discussion with Dr. Elsie Saas will increase the lithium to 1200mg  at hs to increase her sleep, and for ease of compliance. Will recheck level in 3 days. 2. Patient is given the NIDA handout on marijuana use. 3. Disposition is in progress.   Medical Decision Making Problem Points:  Established problem, worsening (2), New problem, with no additional work-up planned (3), Review of last therapy session (1) and Review of psycho-social stressors (1) Data Points:  Review or order clinical lab tests (1) Review or order medicine tests (1) Review of medication regiment & side effects (2) Review of new medications or change in dosage (2)  I certify that inpatient services furnished can reasonably be expected to improve the patient's condition.  Rona Ravens. [place] RPAC 4:04 PM 11/08/2013  Reviewed the information documented and agree with the treatment plan.  Eboni Coval,JANARDHAHA R. 11/08/2013 4:24 PM

## 2013-11-08 NOTE — Progress Notes (Signed)
Adult Psychoeducational Group Note  Date:  11/08/2013 Time:  9:53 PM  Group Topic/Focus:  Wrap-Up Group:   The focus of this group is to help patients review their daily goal of treatment and discuss progress on daily workbooks.  Participation Level:  Active  Participation Quality:  Appropriate  Affect:  Appropriate  Cognitive:  Alert  Insight: Appropriate  Engagement in Group:  Engaged  Modes of Intervention:  Discussion  Additional Comments:  Pt got really upset during the Q&A part of group. Pt talked about how she was being treated by staff, how she feels that her needs are not being met, and that she is really to go.  Kaleen Odea R 11/08/2013, 9:53 PM

## 2013-11-08 NOTE — [facility] Group Notes (Signed)
[facility] LCSW Group Therapy  Emotional Regulation 1:15 - 2: 30 PM        11/08/2013  3:58 PM   Type of Therapy:  Group Therapy  Participation Level:  Appropriate  Participation Quality:  Appropriate  Affect:  Appropriate, Depressed  Cognitive:  Attentive Appropriate  Insight:  Developing/Improving   Engagement in Therapy:  Developing/Improving  Modes of Intervention:  Discussion Exploration Problem-Solving Supportive  Summary of Progress/Problems:  Group topic was emotional regulations.  Patient shared she is not having a good day.  She advised she is coming down from being manic and struggling with her low today.  Wynn Banker 11/08/2013 3:58 PM

## 2013-11-08 NOTE — Progress Notes (Signed)
D:  Patient in the dayroom on approach.  Patient appears flat and depressed.  Patient states she does not feel safe at [facility].  Patient states she does not feel her needs are being met.  Patient states she needs to better understand the things that trigger her PTSD.  Patient irritable and agitated when speaking to Clinical research associate.  Patient will not elaborate on any specific thing that is triggering her anger today.  Patient denies SI/HI and denies AVH. A: Staff to monitor Q 15 mins for safety.  Encouragement and support offered.  Scheduled medications administered per orders. R: Patient remains safe on the unit.  Patient attended group tonight.  Patient visible on the unit.  Patient taking adminsitered medications.

## 2013-11-09 MED ORDER — MELATONIN 5 MG PO TABS
10.0000 mg | ORAL_TABLET | Freq: Every day | ORAL | Status: DC
  Administered 2013-11-09 – 2013-11-12 (×4): 10 mg via ORAL

## 2013-11-09 NOTE — Progress Notes (Addendum)
D:  Patient's self inventory sheet, patient has fair sleep, improving appetite, low energy level, improving attention span.  Rated depression 5, hopeless 1, anxiety 5.  Denied withdrawals.  Denied SI.  Has felt stiffness in past 24 hours, worst pain #2.   "The same as reported yesterday.  Fist step leaving here.  Has questions for staff, but I'm not going to sum it up in 2 lines.  Unhappy with care and service overall.  I do not feel safe here.  I do not have peace of mind here."  No problems taking meds after discharge.     A:  Medications administered per MD orders.  Emotional support and encouragement given patient. R:  Denied SI and HI.  Denied A/V hallucinations.  Denied pain.  Will continue to monitor patient for safety with 15 minute checks.  Safety maintained.

## 2013-11-09 NOTE — Progress Notes (Signed)
[facility] MD Progress Note  11/09/2013 1:37 PM Heidi Baker  MRN:  [id number]  Subjective:  Patient is seen and chart reviewed and case discussed with her physician extender. Patient has been diagnosed with a bipolar disorder posttraumatic stress disorder cannabis abuse. Patient was fond been noncompliant with her medication and presented with depression and suicidal ideation. Review of her labs indicate that she has less than therapeutic levels of lithium. Lithium was increased to 1200 mg at bedtime for therapeutic benefit and therapeutic levels may be monitoring soon. Patient stated that she started feeling better and didn't think she might be able to manage herself without being in the hospital. Patient stated that she slept a little and her appetite is getting better. Patient continued to be manic at this time. Patient has been compliant with her medication without adverse effects. Patient has no symptoms of posttraumatic stress disorder at this time.  Diagnosis:   DSM5: Schizophrenia Disorders:   Obsessive-Compulsive Disorders:   Trauma-Stressor Disorders:  Posttraumatic Stress Disorder (309.81) Substance/Addictive Disorders:   Depressive Disorders:  Major Depressive Disorder - Severe (296.23)  Axis I: Bipolar, mixed, Post Traumatic Stress Disorder, Substance Abuse and Substance Induced Mood Disorder  ADL's:  Intact  Sleep: Poor  Appetite:  Poor  Suicidal Ideation:  Patient has suicidal ideations, intentions and plans  and  contract for safety in the hospital Homicidal Ideation:  Denied AEB (as evidenced by):  Psychiatric Specialty Exam: ROS  Blood pressure 129/97, pulse 94, temperature 98.5 F (36.9 C), temperature source Oral, resp. rate 18, height 5\' 6"  (1.676 m), weight 87.544 kg (193 lb), last menstrual period 10/23/2013, SpO2 100.00%.Body mass index is 31.17 kg/(m^2).  General Appearance: Guarded  Eye Contact::  Minimal  Speech:  Normal Rate  Volume:  Normal  Mood:   Angry, Anxious, Depressed, Hopeless and Worthless  Affect:  Labile  Thought Process:  Goal Directed and Intact  Orientation:  Full (Time, Place, and Person)  Thought Content:  Rumination  Suicidal Thoughts:  Yes.  with intent/plan  Homicidal Thoughts:  No  Memory:  Immediate;   Fair  Judgement:  Intact  Insight:  Fair  Psychomotor Activity:  Decreased  Concentration:  Fair  Recall:  Fair  Akathisia:  NA  Handed:  Right  AIMS (if indicated):     Assets:  Communication Skills Desire for Improvement Financial Resources/Insurance Housing Intimacy Physical Health Resilience Social Support Transportation Vocational/Educational  Sleep:  Number of Hours: 6.25   Current Medications: Current Facility-Administered Medications  Medication Dose Route Frequency Provider Last Rate Last Dose  . acetaminophen (TYLENOL) tablet 650 mg  650 mg Oral Q6H PRN Kristeen Mans, NP   650 mg at 11/07/13 1024  . alum & mag hydroxide-simeth (MAALOX/MYLANTA) 200-200-20 MG/5ML suspension 30 mL  30 mL Oral Q4H PRN Kristeen Mans, NP      . lithium carbonate capsule 1,200 mg  1,200 mg Oral QHS Verne Spurr, PA-C      . magnesium hydroxide (MILK OF MAGNESIA) suspension 30 mL  30 mL Oral Daily PRN Kristeen Mans, NP      . Melatonin TABS 5 mg  5 mg Oral QHS Nehemiah Settle, MD   5 mg at 11/08/13 2238  . traZODone (DESYREL) tablet 50 mg  50 mg Oral QHS PRN,MR X 1 Kerry Hough, PA-C   50 mg at 11/08/13 2343    Lab Results:  Results for orders placed during the hospital encounter of 11/06/13 (from the past 48 hour(s))  LITHIUM LEVEL     Status: Abnormal   Collection Time    11/08/13  6:15 AM      Result Value Range   Lithium Lvl 0.36 (*) 0.80 - 1.40 mEq/L   Comment: Performed at [facility]    Physical Findings: AIMS: Facial and Oral Movements Muscles of Facial Expression: None, normal Lips and Perioral Area: None, normal Jaw: None, normal Tongue: None, normal,Extremity  Movements Upper (arms, wrists, hands, fingers): None, normal Lower (legs, knees, ankles, toes): None, normal, Trunk Movements Neck, shoulders, hips: None, normal, Overall Severity Severity of abnormal movements (highest score from questions above): None, normal Incapacitation due to abnormal movements: None, normal Patient's awareness of abnormal movements (rate only patient's report): No Awareness, Dental Status Current problems with teeth and/or dentures?: No Does patient usually wear dentures?: No  CIWA:  CIWA-Ar Total: 1 COWS:  COWS Total Score: 2  Treatment Plan Summary: Daily contact with patient to assess and evaluate symptoms and progress in treatment Medication management  Plan: Restart home medications and adjust as clinically required Treatment Plan/Recommendations:  1. Continue lithium to 1200mg  at  bedtime for better symptom relief  2. monitor for the therapeutic benefits and therapeutic levels without side effects, recheck level in 3 days. 3. Patient is given the NIDA handout on marijuana use. 4. Disposition is in progress. Patient May Be Discharged on Monday when she continued to show clinical improvement and contracts for safety   Medical Decision Making Problem Points:  Established problem, worsening (2), New problem, with no additional work-up planned (3), Review of last therapy session (1) and Review of psycho-social stressors (1) Data Points:  Review or order clinical lab tests (1) Review or order medicine tests (1) Review of medication regiment & side effects (2) Review of new medications or change in dosage (2)  I certify that inpatient services furnished can reasonably be expected to improve the patient's condition.   Nehemiah Settle., M.D. 11/09/2013 1:37 PM

## 2013-11-09 NOTE — Progress Notes (Signed)
Adult Psychoeducational Group Note  Date:  11/09/2013 Time: 11:00am  Group Topic/Focus:  Making Healthy Choices:   The focus of this group is to help patients identify negative/unhealthy choices they were using prior to admission and identify positive/healthier coping strategies to replace them upon discharge.  Participation Level:  Did Not Attend  Participation Quality:    Affect:    Cognitive:    Insight:   Engagement in Group:    Modes of Intervention:    Additional Comments:  Pt has been in the bed all day  Kleo Dungee M 11/09/2013, 6:50 PM

## 2013-11-09 NOTE — Progress Notes (Signed)
The focus of this group is to educate the patient on the purpose and policies of crisis stabilization and provide a format to answer questions about their admission.  The group details unit policies and expectations of patients while admitted.  Patient attended 0900 nurse education orientation group this morning.  Patient listened, appropriate affect, alert, appropriate insight and engagement.  Today patient will work on 3 goals for discharge.  

## 2013-11-09 NOTE — Progress Notes (Signed)
Pt attended Karaoke.  

## 2013-11-10 NOTE — [facility] Group Notes (Signed)
[facility] LCSW Aftercare Discharge Planning Group Note   11/10/2013 12:57 PM    Participation Quality:  Appropraite  Mood/Affect:  Appropriate  Depression Rating:  2  Anxiety Rating:  5  Thoughts of Suicide:  No  Will you contract for safety?   NA  Current AVH:  No  Plan for Discharge/Comments:  Patient attended discharge planning group and actively participated in group.  Patient to follow up with [facility] [place] for outpatient care.  CSW provided all participants with daily workbook.   Transportation Means: Patient has transportation.   Supports:  Patient has a support system.   Tatia Petrucci, Joesph July

## 2013-11-10 NOTE — Progress Notes (Signed)
Patient ID: Heidi Baker, female   DOB: 09-24-1976, 37 y.o.   MRN: [id number] [facility] MD Progress Note  11/10/2013 5:21 PM Heidi Baker  MRN:  [id number]  Subjective: Heidi Baker is doing well and feels that she is soon to be ready for discharge. She states that the Lithium is helping and that she knows she will need to continue taking it regularly when she is discharged. As today is my list time to see her, she thanks me for my time and care of her during this admission.  Diagnosis:   DSM5: Schizophrenia Disorders:   Obsessive-Compulsive Disorders:   Trauma-Stressor Disorders:  Posttraumatic Stress Disorder (309.81) Substance/Addictive Disorders:   Depressive Disorders:  Major Depressive Disorder - Severe (296.23)  Axis I: Bipolar, mixed, Post Traumatic Stress Disorder, Substance Abuse and Substance Induced Mood Disorder  ADL's:  Intact  Sleep: Poor  Appetite:  Poor  Suicidal Ideation:  Patient has suicidal ideations, intentions and plans  and  contract for safety in the hospital Homicidal Ideation:  Denied AEB (as evidenced by):  Psychiatric Specialty Exam: ROS  Blood pressure 149/108, pulse 97, temperature 98.1 F (36.7 C), temperature source Oral, resp. rate 20, height 5\' 6"  (1.676 m), weight 87.544 kg (193 lb), last menstrual period 10/23/2013, SpO2 100.00%.Body mass index is 31.17 kg/(m^2).  General Appearance: Guarded  Eye Contact::  Minimal  Speech:  Normal Rate  Volume:  Normal  Mood:  Angry, Anxious, Depressed, Hopeless and Worthless  Affect:  Labile  Thought Process:  Goal Directed and Intact  Orientation:  Full (Time, Place, and Person)  Thought Content:  Rumination  Suicidal Thoughts:  Yes.  with intent/plan  Homicidal Thoughts:  No  Memory:  Immediate;   Fair  Judgement:  Intact  Insight:  Fair  Psychomotor Activity:  Decreased  Concentration:  Fair  Recall:  Fair  Akathisia:  NA  Handed:  Right  AIMS (if indicated):     Assets:  Communication  Skills Desire for Improvement Financial Resources/Insurance Housing Intimacy Physical Health Resilience Social Support Transportation Vocational/Educational  Sleep:  Number of Hours: 6.5   Current Medications: Current Facility-Administered Medications  Medication Dose Route Frequency Provider Last Rate Last Dose  . acetaminophen (TYLENOL) tablet 650 mg  650 mg Oral Q6H PRN Kristeen Mans, NP   650 mg at 11/07/13 1024  . alum & mag hydroxide-simeth (MAALOX/MYLANTA) 200-200-20 MG/5ML suspension 30 mL  30 mL Oral Q4H PRN Kristeen Mans, NP      . lithium carbonate capsule 1,200 mg  1,200 mg Oral QHS Verne Spurr, PA-C   1,200 mg at 11/09/13 2237  . magnesium hydroxide (MILK OF MAGNESIA) suspension 30 mL  30 mL Oral Daily PRN Kristeen Mans, NP      . Melatonin TABS 10 mg  10 mg Oral QHS Kristeen Mans, NP   10 mg at 11/09/13 2239  . traZODone (DESYREL) tablet 50 mg  50 mg Oral QHS PRN,MR X 1 Spencer E Simon, PA-C   50 mg at 11/08/13 2343    Lab Results:  No results found for this or any previous visit (from the past 48 hour(s)).  Physical Findings: AIMS: Facial and Oral Movements Muscles of Facial Expression: None, normal Lips and Perioral Area: None, normal Jaw: None, normal Tongue: None, normal,Extremity Movements Upper (arms, wrists, hands, fingers): None, normal Lower (legs, knees, ankles, toes): None, normal, Trunk Movements Neck, shoulders, hips: None, normal, Overall Severity Severity of abnormal movements (highest score from questions above): None, normal Incapacitation  due to abnormal movements: None, normal Patient's awareness of abnormal movements (rate only patient's report): No Awareness, Dental Status Current problems with teeth and/or dentures?: No Does patient usually wear dentures?: No  CIWA:  CIWA-Ar Total: 1 COWS:  COWS Total Score: 2  Treatment Plan Summary: Daily contact with patient to assess and evaluate symptoms and progress in treatment Medication  management  Plan: Restart home medications and adjust as clinically required Treatment Plan/Recommendations:  1. Continue lithium to 1200mg  at  bedtime for better symptom relief  2. monitor for the therapeutic benefits and therapeutic levels without side effects, recheck level in 3 days. 3. Patient is given the NIDA handout on marijuana use. 4. Disposition is in progress. Patient May Be Discharged on Monday when she continued to show clinical improvement and contracts for safety   Medical Decision Making Problem Points:  Established problem, worsening (2), New problem, with no additional work-up planned (3), Review of last therapy session (1) and Review of psycho-social stressors (1) Data Points:  Review or order clinical lab tests (1) Review or order medicine tests (1) Review of medication regiment & side effects (2) Review of new medications or change in dosage (2)  I certify that inpatient services furnished can reasonably be expected to improve the patient's condition.  Rona Ravens. [facility] 11/10/2013 5:21 PM  Reviewed the information documented and agree with the treatment plan.  Phillip Sandler,JANARDHAHA R. 11/12/2013 3:24 PM

## 2013-11-10 NOTE — Progress Notes (Signed)
D: Pt denies SI/HI/AVH/pain at this time. Pt is pleasant and cooperative. Pt BF brought pt 10 mg Melatonin . Pt not very forth coming with information, but Clinical research associate did convince pt to go to [employer], and pt stated she did enjoy herself.   A: Pt was offered support and encouragement. Pt was given scheduled medications. Pt was encourage to attend groups. Q 15 minute checks were done for safety.   R:Pt attends groups  Pt is taking medication. Pt has no complaints at this time.Pt receptive to treatment and safety maintained on unit.

## 2013-11-10 NOTE — Tx Team (Signed)
Interdisciplinary Treatment Plan Update   Date Reviewed:  11/10/2013  Time Reviewed:  9:42 AM  Progress in Treatment:   Attending groups: Yes Participating in groups: Yes Taking medication as prescribed: Yes  Tolerating medication: Yes Family/Significant other contact made: Yes, contact made with fiance. Patient understands diagnosis: Yes  Discussing patient identified problems/goals with staff: Yes Medical problems stabilized or resolved: Yes Denies suicidal/homicidal ideation: Yes Patient has not harmed self or others: Yes  For review of initial/current patient goals, please see plan of care.  Estimated Length of Stay:  3-5 days  Reasons for Continued Hospitalization:  Anxiety Depression Medication stabilization  New Problems/Goals identified:    Discharge Plan or Barriers:   Home with outpatient follow up with [facility]  Additional Comments:    Attendees:  Patient:  11/10/2013 9:42 AM   Signature: Mervyn Gay, MD 11/10/2013 9:42 AM  Signature:  Verne Spurr, PA 11/10/2013 9:42 AM  Signature:  Nestor Ramp, RN 11/10/2013 9:42 AM  Signature: 11/10/2013 9:42 AM  Signature:   11/10/2013 9:42 AM  Signature:  Juline Patch, LCSW 11/10/2013 9:42 AM  Signature:  Reyes Ivan, LCSW 11/10/2013 9:42 AM  Signature:  11/10/2013 9:42 AM  Signature:   11/10/2013 9:42 AM  Signature: Leighton Parody, RN 11/10/2013  9:42 AM  Signature:   Onnie Boer, RN [facility] 11/10/2013  9:42 AM  Signature:  Elizbeth Squires, Team Lead [place] 11/10/2013  9:42 AM    Scribe for Treatment Team:   Juline Patch,  11/10/2013 9:42 AM

## 2013-11-10 NOTE — Progress Notes (Signed)
D: Patient denies SI/HI and A/V hallucinations; ; rates depression as 2/10;rates anxiety as 5-6/10  A: Monitored q 15 minutes; patient encouraged to attend groups; patient educated about medications; patient given medications per physician orders; patient encouraged to express feelings and/or concerns  R: Patient is appropriate but reporting a lot of anxiety; patient is sad; patient; patient is minimal today ; patient's interaction with staff and peers is appropriaye; patient was able to set goal to talk with staff 1:1 when having feelings of SI; patient is taking medications as prescribed and tolerating medications; patient is attending all groups

## 2013-11-10 NOTE — [facility] Group Notes (Signed)
[facility] LCSW Group Therapy  Feelings Around Relapse  11/10/2013 2:49 PM  Type of Therapy:  Group Therapy  Participation Level:  Minimal  Participation Quality:  Appropriate  Affect:  Depressed, Flat and Tearful  Cognitive:  Appropriate  Insight:  Developing/Improving  Engagement in Therapy:  Developing/Improving  Modes of Intervention:  Confrontation, Discussion, Education, Exploration, Problem-solving, Rapport Building and Support  Summary of Progress/Problems:  [facility] LCSW Group Therapy  Feelings Around Relapse 1:15 -2:30        11/10/2013  2:56 PM   Type of Therapy:  Group Therapy  Participation Level:  Appropriate  Participation Quality:  Appropriate  Affect:  Appropriate  Cognitive:  Attentive Appropriate  Insight:  Developing/Improving  Engagement in Therapy: Developing/Improving  Modes of Intervention:  Discussion Exploration Problem-Solving Supportive  Summary of Progress/Problems:  The topic for today was feelings around relapse.    Patient attempted to participate but very tearful.    Wynn Banker 11/10/2013 2:56 PM           Diondre Pulis, [place] July 11/10/2013, 2:49 PM

## 2013-11-11 MED ORDER — CLONAZEPAM 0.5 MG PO TABS
ORAL_TABLET | ORAL | Status: AC
  Filled 2013-11-11: qty 1

## 2013-11-11 MED ORDER — CLONAZEPAM 0.5 MG PO TABS
0.5000 mg | ORAL_TABLET | Freq: Three times a day (TID) | ORAL | Status: DC | PRN
  Administered 2013-11-11 – 2013-11-12 (×3): 0.5 mg via ORAL
  Filled 2013-11-11 (×3): qty 1

## 2013-11-11 NOTE — Progress Notes (Signed)
The focus of this group is to help patients review their daily goal of treatment and discuss progress on daily workbooks. Pt did not attend the evening group. 

## 2013-11-11 NOTE — Progress Notes (Signed)
Psychoeducational Group Note  Date: 11/11/2013 Time:  1015  Group Topic/Focus:  Identifying Needs:   The focus of this group is to help patients identify their personal needs that have been historically problematic and identify healthy behaviors to address their needs.  Participation Level:  Active  Participation Quality:  Attentive  Affect:  Appropriate  Cognitive:  Oriented  Insight:  Improving  Engagement in Group:  Engaged  Additional Comments:    Keontay Vora A 

## 2013-11-11 NOTE — Progress Notes (Signed)
Patient ID: Heidi Baker, female   DOB: [birth year], 37 y.o.   MRN: [id number] [facility] MD Progress Note  11/11/2013 11:15 AM Heidi Baker  MRN:  [id number]  Subjective:  Heidi Baker is a 37year old  Who has been diagnosed with a bipolar disorder posttraumatic stress disorder and cannabis abuse. Patient continued to be manic at this time. Patient has been compliant with her medication without adverse effects. Patient has multiple symptoms of posttraumatic stress disorder at this time tearful, anxiety, and tremors. Pt is unable to talk but states she needs to talk to someone. She is unable to express herself her at this time due to PTSD symptoms. She rates her depression 10/10, anxiety 5/10. She states she wants to stop being depressed, no longer wants to be dependent on drugs. She admits to attending group but was found in the hallway fumbling and squeezing papers in hands, and shaking. Admits to not sleeping at all states the Trazadone doesn't help relax her mind(racing thoughts). Eating is poor but good enough as she can be right now.    Diagnosis:   DSM5: Schizophrenia Disorders:   Obsessive-Compulsive Disorders:   Trauma-Stressor Disorders:  Posttraumatic Stress Disorder (309.81) Substance/Addictive Disorders:   Depressive Disorders:  Major Depressive Disorder - Severe (296.23)  Axis I: Bipolar, mixed, Post Traumatic Stress Disorder, Substance Abuse and Substance Induced Mood Disorder  ADL's:  Intact  Sleep: Poor  Appetite:  Poor  Suicidal Ideation:  Patient has suicidal ideations, intentions and plans  and  contract for safety in the hospital Homicidal Ideation:  Denied AEB (as evidenced by):  Psychiatric Specialty Exam: Review of Systems  Psychiatric/Behavioral: Positive for depression, suicidal ideas and substance abuse. Negative for hallucinations. The patient is nervous/anxious and has insomnia.     Blood pressure 127/92, pulse 109, temperature 98.3 F (36.8 C), temperature  source Oral, resp. rate 18, height 5\' 6"  (1.676 m), weight 87.544 kg (193 lb), last menstrual period 10/23/2013, SpO2 100.00%.Body mass index is 31.17 kg/(m^2).  General Appearance: Guarded  Eye Contact::  Minimal  Speech:  Slow  Volume:  Normal  Mood:  Angry, Anxious, Depressed, Hopeless and Worthless  Affect:  Labile  Thought Process:  Goal Directed and Intact  Orientation:  Full (Time, Place, and Person)  Thought Content:  Rumination  Suicidal Thoughts:  No  Homicidal Thoughts:  No  Memory:  Immediate;   Fair Recent;   Good Remote;   Fair  Judgement:  Intact  Insight:  Fair  Psychomotor Activity:  Decreased  Concentration:  Fair  Recall:  Fair  Akathisia:  NA  Handed:  Right  AIMS (if indicated):     Assets:  Communication Skills Desire for Improvement Financial Resources/Insurance Housing Intimacy Physical Health Resilience Social Support Transportation Vocational/Educational  Sleep:  Number of hours: None   Current Medications: Current Facility-Administered Medications  Medication Dose Route Frequency Provider Last Rate Last Dose  . acetaminophen (TYLENOL) tablet 650 mg  650 mg Oral Q6H PRN Kristeen Mans, NP   650 mg at 11/07/13 1024  . alum & mag hydroxide-simeth (MAALOX/MYLANTA) 200-200-20 MG/5ML suspension 30 mL  30 mL Oral Q4H PRN Kristeen Mans, NP      . clonazePAM Scarlette Calico) tablet 0.5 mg  0.5 mg Oral TID PRN Truman Hayward, FNP      . lithium carbonate capsule 1,200 mg  1,200 mg Oral QHS Verne Spurr, PA-C   1,200 mg at 11/10/13 2135  . magnesium hydroxide (MILK OF MAGNESIA) suspension 30 mL  30  mL Oral Daily PRN Kristeen Mans, NP      . Melatonin TABS 10 mg  10 mg Oral QHS Kristeen Mans, NP   10 mg at 11/10/13 2135  . traZODone (DESYREL) tablet 50 mg  50 mg Oral QHS PRN,MR X 1 Spencer E Simon, PA-C   50 mg at 11/10/13 2350    Lab Results:  No results found for this or any previous visit (from the past 48 hour(s)).  Physical Findings: AIMS: Facial and  Oral Movements Muscles of Facial Expression: None, normal Lips and Perioral Area: None, normal Jaw: None, normal Tongue: None, normal,Extremity Movements Upper (arms, wrists, hands, fingers): None, normal Lower (legs, knees, ankles, toes): None, normal, Trunk Movements Neck, shoulders, hips: None, normal, Overall Severity Severity of abnormal movements (highest score from questions above): None, normal Incapacitation due to abnormal movements: None, normal Patient's awareness of abnormal movements (rate only patient's report): No Awareness, Dental Status Current problems with teeth and/or dentures?: No Does patient usually wear dentures?: No  CIWA:  CIWA-Ar Total: 1 COWS:  COWS Total Score: 2  Treatment Plan Summary: Daily contact with patient to assess and evaluate symptoms and progress in treatment Medication management  Plan: Restart home medications and adjust as clinically required Treatment Plan/Recommendations:  1. Continue lithium to 1200mg  at  bedtime for better symptom relief  2. monitor for the therapeutic benefits and therapeutic levels without side effects. Will obtain lithium level.  3. Patient is given the NIDA handout on marijuana use. 4. Disposition is in progress. Consider Discharge on Monday, however there is a relapse in clinical symptoms, although she is able to contract for safety.   Medical Decision Making Problem Points:  Established problem, worsening (2), New problem, with no additional work-up planned (3), Review of last therapy session (1) and Review of psycho-social stressors (1) Data Points:  Review or order clinical lab tests (1) Review or order medicine tests (1) Review of medication regiment & side effects (2) Review of new medications or change in dosage (2)  I certify that inpatient services furnished can reasonably be expected to improve the patient's condition.   Truman Hayward, FNP-BC. 11/11/2013 11:15 AM

## 2013-11-11 NOTE — Progress Notes (Signed)
.  Psychoeducational Group Note    Date: 11/11/2013 Time: 0930   Goal Setting Purpose of Group: To be able to set a goal that is measurable and that can be accomplished in one day Participation Level:  Active  Participation Quality:  Appropriate  Affect:  Appropriate  Cognitive:  Oriented  Insight:  Improving  Engagement in Group:  Engaged  Additional Comments:    [name] 

## 2013-11-11 NOTE — Progress Notes (Signed)
Writer spoke with patient 1:1 and she appeared very nervous, constantly rubbing her hands together or rubbing on her arms while Clinical research associate spoke with her. Patient reports that she has been thinking about her past and feels that she can't stop thinking about it. Writer encouraged patient to focus on the here and now and take tiny steps to get through the day until she can move farther. Encouraged patient to start her day out by showering and eating her meals and try not to isolate in her room so much. Journal, puzzles and magazines were suggested for her to help keep her mind occupied. Patient reports that she plans to see a therapist after discharge so she won't keep her thoughts bottled up. Patient currently denies si/hi/a/v hallucinations.  Patient did not attend group but decided to shower for the day. Safety maintained on unit with 15 min checks.

## 2013-11-11 NOTE — Progress Notes (Signed)
Writer spoke with patient 1:1 and she reports her day has been good. She attended group this evening and participated. Patient is compliant with her medications and currently denies si/hi/a/v hallucinations. Patient has flat affect and appears guarded during our brief conversation. Safety maintained on unit with 15 min checks.

## 2013-11-11 NOTE — Progress Notes (Signed)
Adult Psychoeducational Group Note  Date:  11/11/2013 Time:  2:30 AM  Group Topic/Focus:  Wrap-Up Group:   The focus of this group is to help patients review their daily goal of treatment and discuss progress on daily workbooks.  Participation Level:  Active  Participation Quality:  Appropriate  Affect:  Appropriate  Cognitive:  Appropriate  Insight: Appropriate  Engagement in Group:  Engaged  Modes of Intervention:  Discussion  Additional Comments:  Pt attended wrap-up group this evening. Pt didn't share much in group but did state "she felt like she was loved by everyone". Pt was very emotional in group but appropriate.    Katilynn Sinkler A 11/11/2013, 2:30 AM

## 2013-11-11 NOTE — Progress Notes (Signed)
D Heidi Baker has had a hard time today. She  Has complained of severe anxiety, agitation, diff " making my mind think". She  Reached a point, this morning, that she was hyperventilating, couldn't stand still, etc...  A She cannot answer this nurse's questions about hearing and / or seeing hallucinations. After she met with NP, she was given prn dose of klonopin 0.5 mg and has slept off and on since taking the klonopin, around 1100.  R Safety is in place and poc maintained. Pt stated she was NOT suicidal, and that she " cannot answer the questions" on her invnetory and agreed to try tomorrow.

## 2013-11-11 NOTE — [facility] Group Notes (Signed)
[facility] Group Notes: (Clinical Social Work)   11/11/2013      Type of Therapy:  Group Therapy   Participation Level:  Did Not Attend    Ambrose Mantle, LCSW 11/11/2013, 4:40 PM

## 2013-11-12 DIAGNOSIS — F411 Generalized anxiety disorder: Secondary | ICD-10-CM

## 2013-11-12 DIAGNOSIS — F1210 Cannabis abuse, uncomplicated: Secondary | ICD-10-CM

## 2013-11-12 DIAGNOSIS — F4310 Post-traumatic stress disorder, unspecified: Secondary | ICD-10-CM

## 2013-11-12 DIAGNOSIS — F3130 Bipolar disorder, current episode depressed, mild or moderate severity, unspecified: Secondary | ICD-10-CM

## 2013-11-12 MED ORDER — PRAZOSIN HCL 2 MG PO CAPS
2.0000 mg | ORAL_CAPSULE | Freq: Every day | ORAL | Status: DC
  Administered 2013-11-12: 2 mg via ORAL
  Filled 2013-11-12 (×3): qty 1

## 2013-11-12 NOTE — [facility] Group Notes (Signed)
[facility] Group Notes: (Clinical Social Work)   11/12/2013      Type of Therapy:  Group Therapy   Participation Level:  Did Not Attend    Ambrose Mantle, LCSW 11/12/2013, 4:11 PM

## 2013-11-12 NOTE — Progress Notes (Signed)
D Natassja is seen sitting in the dayroom. She avoids eye contact. She completes her am self invnentory, writes that she has not had SI within the past 24 hrs and rates her depression and hopelessness "4/1".   A She attends her groups as planned. She cont to be somewhat disorganize.Marland KitchenMarland Kitchenpressured, preoccupied  when she speaks. She says her DC plan is to  " log my behaviors and feelings".   RSafety is maintained and poc moves forward.

## 2013-11-12 NOTE — Progress Notes (Signed)
D.  Pt pleasant but anxious on approach, denies complaints other than insomnia at this time.  Denies SI/HI/hallucinations at this time.  Positive for evening wrap up group.  Pt tends to be somewhat isolative on unit.  A.  Support and encouragement offered  R.  Pt remains safe on unit, will continue to monitor.

## 2013-11-12 NOTE — Progress Notes (Signed)
Psychoeducational Group Note  Date: 11/12/2013 Time:  0930  Group Topic/Focus:  Gratefulness:  The focus of this group is to help patients identify what two things they are most grateful for in their lives. What helps ground them and to center them on their work to their recovery.  Participation Level:  Partisipated  Participation Quality:  Appropriate  Affect:  Appropriate  Cognitive:  Oriented  Insight:  Improving  Engagement in Group:  Engaged  Additional Comments:    Tayshun Gappa A  

## 2013-11-12 NOTE — Progress Notes (Signed)
Psychoeducational Group Note  Date:  11/12/2013 Time:  1015  Group Topic/Focus:  Making Healthy Choices:   The focus of this group is to help patients identify negative/unhealthy choices they were using prior to admission and identify positive/healthier coping strategies to replace them upon discharge.  Participation Level:  Active  Participation Quality:  Appropriate  Affect:  Appropriate  Cognitive:  Oriented  Insight:  Improving  Engagement in Group:  Engaged  Additional Comments:    [name] A 11/12/2013 

## 2013-11-12 NOTE — Progress Notes (Signed)
[facility] MD Progress Note  11/12/2013 11:10 AM Heidi Baker  MRN:  [id number] Subjective:  Patient reluctant to talk despite being visibly upset, "don't want to be sad, need to be careful of what I say."  Tearful on assessment, slow to respond, sleep "good", appetite "fair, forwards little Diagnosis:   DSM5:  Trauma-Stressor Disorders:  Posttraumatic Stress Disorder (309.81) Substance/Addictive Disorders:  Cannabis Use Disorder - Mild (305.20)  Axis I: Anxiety Disorder NOS, Bipolar, Depressed and Post Traumatic Stress Disorder Axis II: Deferred Axis III:  Past Medical History  Diagnosis Date  . Anxiety   . Depression    Axis IV: occupational problems, other psychosocial or environmental problems, problems related to social environment and problems with primary support group Axis V: 41-50 serious symptoms  ADL's:  Intact  Sleep: Good  Appetite:  Fair  Suicidal Ideation:  Plan:  vague Intent:  yes Means:  none Homicidal Ideation:  Denies   Psychiatric Specialty Exam: Review of Systems  Constitutional: Negative.   HENT: Negative.   Eyes: Negative.   Respiratory: Negative.   Cardiovascular: Negative.   Gastrointestinal: Negative.   Genitourinary: Negative.   Musculoskeletal: Negative.   Skin: Negative.   Neurological: Negative.   Endo/Heme/Allergies: Negative.   Psychiatric/Behavioral: Positive for depression and suicidal ideas. The patient is nervous/anxious.     Blood pressure 119/89, pulse 105, temperature 98.7 F (37.1 C), temperature source Oral, resp. rate 16, height 5\' 6"  (1.676 m), weight 87.544 kg (193 lb), last menstrual period 10/23/2013, SpO2 100.00%.Body mass index is 31.17 kg/(m^2).  General Appearance: Casual  Eye Contact::  Poor  Speech:  Slow  Volume:  Decreased  Mood:  Anxious and Depressed  Affect:  Congruent  Thought Process:  Coherent  Orientation:  Full (Time, Place, and Person)  Thought Content:  WDL  Suicidal Thoughts:  Yes.  with  intent/plan  Homicidal Thoughts:  No  Memory:  Immediate;   Fair Recent;   Fair Remote;   Fair  Judgement:  Fair  Insight:  Lacking  Psychomotor Activity:  Decreased  Concentration:  Fair  Recall:  Fair  Akathisia:  No  Handed:  Right  AIMS (if indicated):     Assets:  Physical Health Resilience Social Support  Sleep:  Number of Hours: 5.25   Current Medications: Current Facility-Administered Medications  Medication Dose Route Frequency Provider Last Rate Last Dose  . acetaminophen (TYLENOL) tablet 650 mg  650 mg Oral Q6H PRN Kristeen Mans, NP   650 mg at 11/07/13 1024  . alum & mag hydroxide-simeth (MAALOX/MYLANTA) 200-200-20 MG/5ML suspension 30 mL  30 mL Oral Q4H PRN Kristeen Mans, NP      . clonazePAM Scarlette Calico) tablet 0.5 mg  0.5 mg Oral TID PRN Truman Hayward, FNP   0.5 mg at 11/11/13 1838  . lithium carbonate capsule 1,200 mg  1,200 mg Oral QHS Verne Spurr, PA-C   1,200 mg at 11/11/13 2222  . magnesium hydroxide (MILK OF MAGNESIA) suspension 30 mL  30 mL Oral Daily PRN Kristeen Mans, NP      . Melatonin TABS 10 mg  10 mg Oral QHS Kristeen Mans, NP   10 mg at 11/11/13 2223  . prazosin (MINIPRESS) capsule 2 mg  2 mg Oral QHS Nanine Means, NP      . traZODone (DESYREL) tablet 50 mg  50 mg Oral QHS PRN,MR X 1 Kerry Hough, PA-C   50 mg at 11/11/13 2222    Lab Results:  Results  for orders placed during the hospital encounter of 11/06/13 (from the past 48 hour(s))  LITHIUM LEVEL     Status: Abnormal   Collection Time    11/11/13  7:43 PM      Result Value Range   Lithium Lvl 0.58 (*) 0.80 - 1.40 mEq/L   Comment: Performed at [facility]    Physical Findings: AIMS: Facial and Oral Movements Muscles of Facial Expression: None, normal Lips and Perioral Area: None, normal Jaw: None, normal Tongue: None, normal,Extremity Movements Upper (arms, wrists, hands, fingers): None, normal Lower (legs, knees, ankles, toes): None, normal, Trunk  Movements Neck, shoulders, hips: None, normal, Overall Severity Severity of abnormal movements (highest score from questions above): None, normal Incapacitation due to abnormal movements: None, normal Patient's awareness of abnormal movements (rate only patient's report): No Awareness, Dental Status Current problems with teeth and/or dentures?: No Does patient usually wear dentures?: No  CIWA:  CIWA-Ar Total: 1 COWS:  COWS Total Score: 2  Treatment Plan Summary: Daily contact with patient to assess and evaluate symptoms and progress in treatment Medication management  Plan:  Review of chart, vital signs, medications, and notes. 1-Individual and group therapy 2-Medication management for depression and anxiety:  Medications reviewed with the patient and she stated no untoward effects, Minipress for nightmares at bedtime 3-Coping skills for depression, anxiety, and PTSD 4-Continue crisis stabilization and management 5-Address health issues--monitoring vital signs, stable 6-Treatment plan in progress to prevent relapse of depression, PTSD, and anxiety  Medical Decision Making Problem Points:  Established problem, stable/improving (1) and Review of psycho-social stressors (1) Data Points:  Review of medication regiment & side effects (2)  I certify that inpatient services furnished can reasonably be expected to improve the patient's condition.   Nanine Means, PMH-NP 11/12/2013, 11:10 AM  Reviewed the information documented and agree with the treatment plan.  Thersa Mohiuddin,JANARDHAHA R. 11/12/2013 12:10 PM

## 2013-11-13 MED ORDER — MELATONIN 5 MG PO TABS: 10.0000 mg | ORAL_TABLET | Freq: Every day | ORAL | Status: DC

## 2013-11-13 MED ORDER — CLONAZEPAM 0.5 MG PO TABS: 0.5000 mg | ORAL_TABLET | Freq: Three times a day (TID) | ORAL | Status: DC | PRN

## 2013-11-13 MED ORDER — PRAZOSIN HCL 2 MG PO CAPS: 2.0000 mg | ORAL_CAPSULE | Freq: Every day | ORAL | Status: DC

## 2013-11-13 MED ORDER — LITHIUM CARBONATE 600 MG PO CAPS: 1200.0000 mg | ORAL_CAPSULE | Freq: Every day | ORAL | Status: DC

## 2013-11-13 MED ORDER — TRAZODONE HCL 50 MG PO TABS: 50.0000 mg | ORAL_TABLET | Freq: Every evening | ORAL | Status: DC | PRN

## 2013-11-13 NOTE — BHH Group Notes (Signed)
[facility] LCSW Aftercare Discharge Planning Group Note   11/13/2013 10:53 AM    Participation Quality:  Appropraite  Mood/Affect:  Appropriate  Depression Rating:  0  Anxiety Rating:  0  Thoughts of Suicide:  No  Will you contract for safety?   NA  Current AVH:  No  Plan for Discharge/Comments:  Patient attended discharge planning group and actively participated in group.  Patient reports doing well and being ready to discharge today.  She will follow up with [facility].   CSW provided all participants with daily workbook.   Transportation Means: Patient has transportation.   Supports:  Patient has a support system.   Zian Mohamed, Joesph July
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
## 2013-11-13 NOTE — Tx Team (Signed)
Interdisciplinary Treatment Plan Update   Date Reviewed:  11/13/2013  Time Reviewed:  9:55 AM  Progress in Treatment:   Attending groups: Yes Participating in groups: Yes Taking medication as prescribed: Yes  Tolerating medication: Yes Family/Significant other contact made: Yes, contact made with fiance. Patient understands diagnosis: Yes  Discussing patient identified problems/goals with staff: Yes Medical problems stabilized or resolved: Yes Denies suicidal/homicidal ideation: Yes Patient has not harmed self or others: Yes  For review of initial/current patient goals, please see plan of care.  Estimated Length of Stay:  Discharge today  Reasons for Continued Hospitalization:   New Problems/Goals identified:    Discharge Plan or Barriers:   Home with outpatient follow up with [facility]  Additional Comments:  N/A  Attendees:  Patient:  11/13/2013 9:55 AM   Signature: Mervyn Gay, MD 11/13/2013 9:55 AM  Signature:  Silverio Decamp, NP 11/13/2013 9:55 AM  Signature: Neill Loft, RN 11/13/2013 9:55 AM  Signature: Quintella Reichert, RN 11/13/2013 9:55 AM  Signature:  Claudette Head, NP 11/13/2013 9:55 AM  Signature:  Juline Patch, LCSW 11/13/2013 9:55 AM  Signature:  11/13/2013 9:55 AM  Signature:  11/13/2013 9:55 AM  Signature:   11/13/2013 9:55 AM  Signature:  11/13/2013  9:55 AM  Signature:   Onnie Boer, RN [facility] 11/13/2013  9:55 AM  Signature:  Elizbeth Squires, Team Lead [place] 11/13/2013  9:55 AM    Scribe for Treatment Team:   Juline Patch,  11/13/2013 9:55 AM

## 2013-11-13 NOTE — Progress Notes (Signed)
Adult Psychoeducational Group Note  Date:  11/13/2013 Time:  4:41 AM  Group Topic/Focus:  Wrap-Up Group:   The focus of this group is to help patients review their daily goal of treatment and discuss progress on daily workbooks.  Participation Level:  Active  Participation Quality:  Appropriate  Affect:  Appropriate  Cognitive:  Alert  Insight: Good  Engagement in Group:  Engaged  Modes of Intervention:  Discussion  Additional Comments:  Patient states that she had a good day. Patient also says that she feels better now than she did when she first came in. Pt. Says that she is trying to stay positive about everything and she has a lot of gratitude to the staff here.  [facility] 11/13/2013, 4:41 AM

## 2013-11-13 NOTE — Progress Notes (Signed)
[facility] Adult Case Management Discharge Plan :  Will you be returning to the same living situation after discharge: Yes,  Patient to return home with fiance. At discharge, do you have transportation home?:Yes,  Patient to arrange transportation home. Do you have the ability to pay for your medications:Yes,  Patient can obtain medications.  Release of information consent forms completed and in the chart;  Patient's signature needed at discharge.  Patient to Follow up at: Follow-up Information   Follow up with [facility] On 12/01/2013. (Friday, December 01, 2013 at 4:00 PM)    Contact information:   [address] [place], [place]   [address]  [phone]      Patient no longer endorsing SI/HI or other thoughts of self harm. atient denies SI/HI:  .Lexicographer and Suicide Prevention discussed:  .Reviewed with all patients during discharge planning group  Heidi Baker, Joesph July 11/13/2013, 10:52 AM

## 2013-11-13 NOTE — Progress Notes (Signed)
Patient ID: Heidi Baker, female   DOB: 04/12/1976, 37 y.o.   MRN: [id number] Patient was discharged ambulatory to ride home with her fiance.  She denies SI/HI.  She verbalizes understanding of her discharge meds and followup.  She was given scripts .  She plans to follow up with [facility].  She also says she plans to stay on her lithium.  Talked with her about the importance of this.  She is hopeful and appreciative of care received.

## 2013-11-13 NOTE — BHH Suicide Risk Assessment (Signed)
Suicide Risk Assessment  Discharge Assessment     Demographic Factors:  Adolescent or young adult, Low socioeconomic status and Unemployed  Mental Status Per Nursing Assessment::   On Admission:     Current Mental Status by Physician: The patient was calm and cooperative. Patient has normal psychomotor activity. Patient has normal speech and thought process. Patient has good mood with appropriate affect. Patient has denied suicide and homicidal ideations, intentions and plans. Patient has no evidence of psychotic symptoms. Patient has fair insight judgment and impulse  Loss Factors: Decrease in vocational status and Financial problems/change in socioeconomic status  Historical Factors: Prior suicide attempts, Family history of mental illness or substance abuse and Impulsivity  Risk Reduction Factors:   Sense of responsibility to family, Religious beliefs about death, Living with another person, especially a relative, Positive social support, Positive therapeutic relationship and Positive coping skills or problem solving skills  Continued Clinical Symptoms:  Bipolar Disorder:   Mixed State Depression:   Recent sense of peace/wellbeing Alcohol/Substance Abuse/Dependencies Previous Psychiatric Diagnoses and Treatments  Cognitive Features That Contribute To Risk:  Polarized thinking    Suicide Risk:  Minimal: No identifiable suicidal ideation.  Patients presenting with no risk factors but with morbid ruminations; may be classified as minimal risk based on the severity of the depressive symptoms  Discharge Diagnoses:   AXIS I:  Bipolar, mixed, Post Traumatic Stress Disorder and Cannabis abuse AXIS II:  Deferred AXIS III:   Past Medical History  Diagnosis Date  . Anxiety   . Depression    AXIS IV:  economic problems, occupational problems, other psychosocial or environmental problems, problems related to social environment and problems with primary support group AXIS V:  61-70  mild symptoms  Plan Of Care/Follow-up recommendations:  Activity:  As  tolerated Diet:  Regular  Is patient on multiple antipsychotic therapies at discharge:  No   Has Patient had three or more failed trials of antipsychotic monotherapy by history:  No  Recommended Plan for Multiple Antipsychotic Therapies: NA  Heidi Baker,Heidi R. 11/13/2013, 1:10 PM

## 2013-11-14 NOTE — Progress Notes (Signed)
Patient Discharge Instructions:  After Visit Summary (AVS):   Faxed to:  11/14/13 Discharge Summary Note:   Faxed to:  11/14/13 Psychiatric Admission Assessment Note:   Faxed to:  11/14/13 Suicide Risk Assessment - Discharge Assessment:   Faxed to:  11/14/13 Faxed/Sent to the Next Level Care provider:  11/14/13 Faxed to [facility] @ [phone]  Jerelene Redden, 11/14/2013, 1:48 PM

## 2013-12-28 ENCOUNTER — Ambulatory Visit (HOSPITAL_COMMUNITY): Payer: Self-pay | Admitting: Psychiatry

## 2014-01-02 ENCOUNTER — Ambulatory Visit (HOSPITAL_COMMUNITY): Payer: Self-pay | Admitting: Psychiatry

## 2014-01-09 ENCOUNTER — Ambulatory Visit (HOSPITAL_COMMUNITY): Payer: Self-pay | Admitting: Psychiatry

## 2014-01-16 ENCOUNTER — Ambulatory Visit (HOSPITAL_COMMUNITY): Payer: Self-pay | Admitting: Psychiatry

## 2014-01-19 ENCOUNTER — Ambulatory Visit: Payer: Self-pay | Admitting: Dietician

## 2014-01-22 ENCOUNTER — Ambulatory Visit (HOSPITAL_COMMUNITY): Payer: Self-pay | Admitting: Psychiatry

## 2014-01-23 ENCOUNTER — Ambulatory Visit (HOSPITAL_COMMUNITY): Payer: Self-pay | Admitting: Psychiatry

## 2014-02-05 ENCOUNTER — Ambulatory Visit (INDEPENDENT_AMBULATORY_CARE_PROVIDER_SITE_OTHER): Payer: BC Managed Care – PPO | Admitting: Psychiatry

## 2014-02-05 ENCOUNTER — Encounter (INDEPENDENT_AMBULATORY_CARE_PROVIDER_SITE_OTHER): Payer: Self-pay

## 2014-02-05 DIAGNOSIS — F3110 Bipolar disorder, current episode manic without psychotic features, unspecified: Secondary | ICD-10-CM
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
## 2014-02-19 ENCOUNTER — Ambulatory Visit (HOSPITAL_COMMUNITY): Payer: Self-pay | Admitting: Psychiatry

## 2014-02-19 ENCOUNTER — Ambulatory Visit: Payer: Self-pay | Admitting: Dietician

## 2014-04-24 ENCOUNTER — Ambulatory Visit: Payer: Self-pay | Admitting: Dietician

## 2014-10-02 ENCOUNTER — Ambulatory Visit: Payer: BC Managed Care – PPO

## 2014-10-02 ENCOUNTER — Telehealth: Payer: Self-pay | Admitting: Hematology and Oncology

## 2014-10-02 ENCOUNTER — Encounter: Payer: Self-pay | Admitting: Hematology and Oncology

## 2014-10-02 ENCOUNTER — Ambulatory Visit (HOSPITAL_BASED_OUTPATIENT_CLINIC_OR_DEPARTMENT_OTHER): Payer: BC Managed Care – PPO | Admitting: Hematology and Oncology

## 2014-10-02 VITALS — BP 130/87 | HR 79 | Temp 98.3°F | Resp 18 | Ht 66.0 in | Wt 264.7 lb

## 2014-10-02 DIAGNOSIS — D509 Iron deficiency anemia, unspecified: Secondary | ICD-10-CM

## 2014-10-02 NOTE — Assessment & Plan Note (Addendum)
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 We discussed some of the risks, benefits, and alternatives of intravenous iron infusions. The patient is symptomatic from anemia and the iron level is critically low. She tolerated oral iron supplement poorly and desires to achieved higher levels of iron faster for adequate hematopoesis. Some of the side-effects to be expected including risks of infusion reactions, phlebitis, headaches, nausea and fatigue.  The patient is willing to proceed. Patient education material was dispensed.  Goal is to keep ferritin level greater than 50 I will proceed with treatment next week and see her back a month from now with repeat blood work prior to recheck her CBC.

## 2014-10-02 NOTE — Telephone Encounter (Signed)
gv pt appt schedule for nov/dec. °

## 2014-10-02 NOTE — Progress Notes (Signed)
[facility] CONSULT NOTE  Patient Care Team: Ileana LaddFrancis P Wong, MD as PCP - General (Family Medicine)  CHIEF COMPLAINTS/PURPOSE OF CONSULTATION:  Fatigue with iron deficiency anemia  HISTORY OF PRESENTING ILLNESS:  Heidi Baker 38 y.o. female is here because of anemia. The patient had back or history of menorrhagia for many years and had received iron infusion in 2009. She had heavy menstruation at one point, lasting 20 days. Recently, she is undergoing fertility treatment and had no periods for 2 months.  She was found to have abnormal CBC from recent blood work. Her CBC from 06/21/2013 was low at 11.2. On 09/17/2014, her hemoglobin has dropped and 9 with MCV of 72.7. Iron studies confirmed iron deficiency anemia. She denies recent chest pain on exertion, but they complain ofshortness of breath on minimal exertion, pre-syncopal episodes, palpitations and leg cramps. She had not noticed any recent bleeding such as epistaxis, hematuria or hematochezia The patient denies over the counter NSAID ingestion. She is not on antiplatelets agents.  She had no prior history or diagnosis of cancer. Her age appropriate screening programs are up-to-date. She has significant pica with chewing of ice. She eats a variety of diet. She never donated blood or received blood transfusion The patient was prescribed oral iron supplements and she takes 2-3 times a day with meals for the past 1 week.  MEDICAL HISTORY:  Past Medical History  Diagnosis Date  . Anxiety   . Depression     SURGICAL HISTORY: Past Surgical History  Procedure Laterality Date  . Uterine fibroid surgery  2009    SOCIAL HISTORY: History   Social History  . Marital Status: Single    Spouse Name: N/A    Number of Children: N/A  . Years of Education: N/A   Occupational History  . Not on file.   Social History Main Topics  . Smoking status: Former Smoker -- 0.50 packs/day for 8 years    Types: Cigarettes   Quit date: 11/06/2012  . Smokeless tobacco: Never Used  . Alcohol Use: 0.6 oz/week    1 Glasses of wine per week     Comment: one alcoholic drink monthly, wine or mixed drink  . Drug Use: 4.00 per week    Special: Marijuana     Comment: THC  . Sexual Activity: Yes   Other Topics Concern  . Not on file   Social History Narrative    FAMILY HISTORY: History reviewed. No pertinent family history.  ALLERGIES:  is allergic to amoxicillin.  MEDICATIONS:  Current Outpatient Prescriptions  Medication Sig Dispense Refill  . clomiPHENE (CLOMID) 50 MG tablet Take 100 mg by mouth daily.    . medroxyPROGESTERone (PROVERA) 10 MG tablet Take 10 mg by mouth daily. For 7 days    . SEROQUEL XR 400 MG 24 hr tablet Take 400 mg by mouth daily.  3   No current facility-administered medications for this visit.    REVIEW OF SYSTEMS:   Constitutional: Denies fevers, chills or abnormal night sweats Eyes: Denies blurriness of vision, double vision or watery eyes Ears, nose, mouth, throat, and face: Denies mucositis or sore throat Respiratory: Denies cough, dyspnea or wheezes Cardiovascular: Denies palpitation, chest discomfort or lower extremity swelling Gastrointestinal:  Denies nausea, heartburn or change in bowel habits Skin: Denies abnormal skin rashes Lymphatics: Denies new lymphadenopathy or easy bruising Neurological:Denies numbness, tingling or new weaknesses Behavioral/Psych: Mood is stable, no new changes  All other systems were reviewed with the patient and  are negative.  PHYSICAL EXAMINATION: ECOG PERFORMANCE STATUS: 1 - Symptomatic but completely ambulatory  Filed Vitals:   10/02/14 1425  BP: 130/87  Pulse: 79  Temp: 98.3 F (36.8 C)  Resp: 18   Filed Weights   10/02/14 1425  Weight: 264 lb 11.2 oz (120.067 kg)    GENERAL:alert, no distress and comfortable. She is morbidly obese SKIN: skin color, texture, turgor are normal, no rashes or significant lesions EYES: normal,  conjunctiva are pale and non-injected, sclera clear OROPHARYNX:no exudate, no erythema and lips, buccal mucosa, and tongue normal  NECK: supple, thyroid normal size, non-tender, without nodularity LYMPH:  no palpable lymphadenopathy in the cervical, axillary or inguinal LUNGS: clear to auscultation and percussion with normal breathing effort HEART: regular rate & rhythm and no murmurs and no lower extremity edema ABDOMEN:abdomen soft, non-tender and normal bowel sounds Musculoskeletal:no cyanosis of digits and no clubbing  PSYCH: alert & oriented x 3 with fluent speech NEURO: no focal motor/sensory deficits  LABORATORY DATA:  I have reviewed the data as listed No results found for this or any previous visit (from the past 2160 hour(s)).  ASSESSMENT & PLAN:  Iron deficiency anemia The most likely cause of her anemia is due to chronic blood loss from menorrhagia. We discussed some of the risks, benefits, and alternatives of intravenous iron infusions. The patient is symptomatic from anemia and the iron level is critically low. She tolerated oral iron supplement poorly and desires to achieved higher levels of iron faster for adequate hematopoesis. Some of the side-effects to be expected including risks of infusion reactions, phlebitis, headaches, nausea and fatigue.  The patient is willing to proceed. Patient education material was dispensed.  Goal is to keep ferritin level greater than 50 I will proceed with treatment next week and see her back a month from now with repeat blood work prior to recheck her CBC.     All questions were answered. The patient knows to call the clinic with any problems, questions or concerns. I spent 40 minutes counseling the patient face to face. The total time spent in the appointment was 55 minutes and more than 50% was on counseling.     Camron Monday, MD 10/02/2014 4:05 PM

## 2014-10-09 ENCOUNTER — Other Ambulatory Visit: Payer: Self-pay | Admitting: Hematology and Oncology

## 2014-10-09 ENCOUNTER — Ambulatory Visit (HOSPITAL_BASED_OUTPATIENT_CLINIC_OR_DEPARTMENT_OTHER): Payer: BC Managed Care – PPO | Admitting: Nurse Practitioner

## 2014-10-09 ENCOUNTER — Ambulatory Visit: Payer: BC Managed Care – PPO

## 2014-10-09 ENCOUNTER — Emergency Department (HOSPITAL_COMMUNITY)
Admission: EM | Admit: 2014-10-09 | Discharge: 2014-10-09 | Disposition: A | Discharge status: Home / Self Care | Payer: BC Managed Care – PPO | Attending: Emergency Medicine | Admitting: Emergency Medicine

## 2014-10-09 DIAGNOSIS — D509 Iron deficiency anemia, unspecified: Secondary | ICD-10-CM | POA: Insufficient documentation

## 2014-10-09 DIAGNOSIS — F419 Anxiety disorder, unspecified: Secondary | ICD-10-CM | POA: Diagnosis not present

## 2014-10-09 DIAGNOSIS — Z87891 Personal history of nicotine dependence: Secondary | ICD-10-CM | POA: Insufficient documentation

## 2014-10-09 DIAGNOSIS — R0789 Other chest pain: Secondary | ICD-10-CM | POA: Insufficient documentation

## 2014-10-09 DIAGNOSIS — R079 Chest pain, unspecified: Secondary | ICD-10-CM | POA: Diagnosis present

## 2014-10-09 DIAGNOSIS — Z880 Allergy status to penicillin: Secondary | ICD-10-CM | POA: Diagnosis not present

## 2014-10-09 DIAGNOSIS — Z79899 Other long term (current) drug therapy: Secondary | ICD-10-CM | POA: Insufficient documentation

## 2014-10-09 DIAGNOSIS — F329 Major depressive disorder, single episode, unspecified: Secondary | ICD-10-CM | POA: Insufficient documentation

## 2014-10-09 DIAGNOSIS — Z862 Personal history of diseases of the blood and blood-forming organs and certain disorders involving the immune mechanism: Secondary | ICD-10-CM

## 2014-10-09 LAB — COMPREHENSIVE METABOLIC PANEL
ALT: 15 U/L (ref 0–35)
ANION GAP: 9 (ref 5–15)
AST: 27 U/L (ref 0–37)
Albumin: 3.3 g/dL — ABNORMAL LOW (ref 3.5–5.2)
Alkaline Phosphatase: 61 U/L (ref 39–117)
BUN: 11 mg/dL (ref 6–23)
CHLORIDE: 109 meq/L (ref 96–112)
CO2: 22 meq/L (ref 19–32)
Calcium: 8.9 mg/dL (ref 8.4–10.5)
Creatinine, Ser: 0.75 mg/dL (ref 0.50–1.10)
GFR calc non Af Amer: >90 mL/min (ref >90)
GLUCOSE: 90 mg/dL (ref 70–99)
POTASSIUM: 4.6 meq/L (ref 3.7–5.3)
SODIUM: 140 meq/L (ref 137–147)
Total Protein: 6.5 g/dL (ref 6.0–8.3)

## 2014-10-09 LAB — CBC WITH DIFFERENTIAL/PLATELET
Basophils Absolute: 0.1 10*3/uL (ref 0.0–0.1)
Basophils Relative: 2 % — ABNORMAL HIGH (ref 0–1)
Eosinophils Absolute: 0.5 10*3/uL (ref 0.0–0.7)
Eosinophils Relative: 9 % — ABNORMAL HIGH (ref 0–5)
HCT: 29.5 % — ABNORMAL LOW (ref 36.0–46.0)
Hemoglobin: 9 g/dL — ABNORMAL LOW (ref 12.0–15.0)
LYMPHS ABS: 1.4 10*3/uL (ref 0.7–4.0)
LYMPHS PCT: 26 % (ref 12–46)
MCH: 22.9 pg — ABNORMAL LOW (ref 26.0–34.0)
MCHC: 30.5 g/dL (ref 30.0–36.0)
MCV: 75.1 fL — ABNORMAL LOW (ref 78.0–100.0)
MONOS PCT: 10 % (ref 3–12)
Monocytes Absolute: 0.5 10*3/uL (ref 0.1–1.0)
NEUTROS ABS: 2.9 10*3/uL (ref 1.7–7.7)
NEUTROS PCT: 54 % (ref 43–77)
PLATELETS: 126 10*3/uL — AB (ref 150–400)
RBC: 3.93 MIL/uL (ref 3.87–5.11)
RDW: 20.3 % — ABNORMAL HIGH (ref 11.5–15.5)
WBC: 5.4 10*3/uL (ref 4.0–10.5)

## 2014-10-09 LAB — TROPONIN I: Troponin I: <0.3 ng/mL (ref <0.30)

## 2014-10-09 NOTE — Discharge Instructions (Signed)
Dr. Maxine GlennGorsuch's office will call you tomorrow to arrange a rescheduling of your iron infusion.  Return to the emergency department if your symptoms substantially worsen or change.   Chest Pain (Nonspecific) It is often hard to give a specific diagnosis for the cause of chest pain. There is always a chance that your pain could be related to something serious, such as a heart attack or a blood clot in the lungs. You need to follow up with your health care provider for further evaluation. CAUSES   Heartburn.  Pneumonia or bronchitis.  Anxiety or stress.  Inflammation around your heart (pericarditis) or lung (pleuritis or pleurisy).  A blood clot in the lung.  A collapsed lung (pneumothorax). It can develop suddenly on its own (spontaneous pneumothorax) or from trauma to the chest.  Shingles infection (herpes zoster virus). The chest wall is composed of bones, muscles, and cartilage. Any of these can be the source of the pain.  The bones can be bruised by injury.  The muscles or cartilage can be strained by coughing or overwork.  The cartilage can be affected by inflammation and become sore (costochondritis). DIAGNOSIS  Lab tests or other studies may be needed to find the cause of your pain. Your health care provider may have you take a test called an ambulatory electrocardiogram (ECG). An ECG records your heartbeat patterns over a 24-hour period. You may also have other tests, such as:  Transthoracic echocardiogram (TTE). During echocardiography, sound waves are used to evaluate how blood flows through your heart.  Transesophageal echocardiogram (TEE).  Cardiac monitoring. This allows your health care provider to monitor your heart rate and rhythm in real time.  Holter monitor. This is a portable device that records your heartbeat and can help diagnose heart arrhythmias. It allows your health care provider to track your heart activity for several days, if needed.  Stress tests by  exercise or by giving medicine that makes the heart beat faster. TREATMENT   Treatment depends on what may be causing your chest pain. Treatment may include:  Acid blockers for heartburn.  Anti-inflammatory medicine.  Pain medicine for inflammatory conditions.  Antibiotics if an infection is present.  You may be advised to change lifestyle habits. This includes stopping smoking and avoiding alcohol, caffeine, and chocolate.  You may be advised to keep your head raised (elevated) when sleeping. This reduces the chance of acid going backward from your stomach into your esophagus. Most of the time, nonspecific chest pain will improve within 2-3 days with rest and mild pain medicine.  HOME CARE INSTRUCTIONS   If antibiotics were prescribed, take them as directed. Finish them even if you start to feel better.  For the next few days, avoid physical activities that bring on chest pain. Continue physical activities as directed.  Do not use any tobacco products, including cigarettes, chewing tobacco, or electronic cigarettes.  Avoid drinking alcohol.  Only take medicine as directed by your health care provider.  Follow your health care provider's suggestions for further testing if your chest pain does not go away.  Keep any follow-up appointments you made. If you do not go to an appointment, you could develop lasting (chronic) problems with pain. If there is any problem keeping an appointment, call to reschedule. SEEK MEDICAL CARE IF:   Your chest pain does not go away, even after treatment.  You have a rash with blisters on your chest.  You have a fever. SEEK IMMEDIATE MEDICAL CARE IF:   You have  increased chest pain or pain that spreads to your arm, neck, jaw, back, or abdomen.  You have shortness of breath.  You have an increasing cough, or you cough up blood.  You have severe back or abdominal pain.  You feel nauseous or vomit.  You have severe weakness.  You  faint.  You have chills. This is an emergency. Do not wait to see if the pain will go away. Get medical help at once. Call your local emergency services (911 in [place] Do not drive yourself to the hospital. MAKE SURE YOU:   Understand these instructions.  Will watch your condition.  Will get help right away if you are not doing well or get worse. Document Released: 08/19/2005 Document Revised: 11/14/2013 Document Reviewed: 06/14/2008 [facility] Patient Information 2015 [place], [place]. This information is not intended to replace advice given to you by your health care provider. Make sure you discuss any questions you have with your health care provider.

## 2014-10-09 NOTE — ED Notes (Signed)
Pt comes from cancer center where she was supposed to get IV iron transfusion today. RN reports that after getting IV established pt felt dizzy and c/o left sided chest pain. Pt describes pain as pressure and rated 2/10.  Pt initial BP was 130s/90s 94/55, now back to 120s/60s.  Pt had about NS. 22g in left forearm.  Pt on 2L O2 via [place] for comfort.  Pt has been menstrating for about 5 days which has been heavy cycle, pt states that she has to change her pad about every and does have large clots.  Pt is currently taking fertility meds.

## 2014-10-09 NOTE — ED Notes (Signed)
Bed: WA07 Expected date:  Expected time:  Means of arrival:  Comments: Cancer center-chest pain

## 2014-10-09 NOTE — ED Provider Notes (Signed)
CSN: [id number]     Arrival date & time 10/09/14  1309 History   First MD Initiated Contact with Patient 10/09/14 1405     Chief Complaint  Patient presents with  . Chest Pain  . Dizziness     (Consider location/radiation/quality/duration/timing/severity/associated sxs/prior Treatment) HPI Comments: Patient is a 38 year old female with history of iron deficiency anemia. She presents today for evaluation of chest pressure and near syncope that occurred while she was having an IV placed at the hematology clinic. She went there today for an iron infusion. She was sent here for further evaluation of this episode. Her symptoms have since resolved and she now feels much better. She denies any fevers or chills. She denies to me she has had any cough. She denies any leg swelling.  Patient is a 38 y.o. female presenting with chest pain and dizziness. The history is provided by the patient.  Chest Pain Pain location:  Substernal area Pain quality: pressure   Pain radiates to:  Does not radiate Pain radiates to the back: no   Pain severity:  Moderate Onset quality:  Sudden Timing:  Constant Progression:  Resolved Chronicity:  New Relieved by:  Nothing Worsened by:  Nothing tried Ineffective treatments:  None tried Associated symptoms: dizziness   Dizziness Associated symptoms: chest pain     Past Medical History  Diagnosis Date  . Anxiety   . Depression    Past Surgical History  Procedure Laterality Date  . Uterine fibroid surgery  2009   No family history on file. History  Substance Use Topics  . Smoking status: Former Smoker -- 0.50 packs/day for 8 years    Types: Cigarettes    Quit date: 11/06/2012  . Smokeless tobacco: Never Used  . Alcohol Use: 0.6 oz/week    1 Glasses of wine per week     Comment: one alcoholic drink monthly, wine or mixed drink   OB History    No data available     Review of Systems  Cardiovascular: Positive for chest pain.  Neurological:  Positive for dizziness.  All other systems reviewed and are negative.     Allergies  Amoxicillin  Home Medications   Prior to Admission medications   Medication Sig Start Date End Date Taking? Authorizing Provider  QUEtiapine (SEROQUEL XR) 200 MG 24 hr tablet Take 200 mg by mouth at bedtime. Take along with the 400 mg pill to = 600 mg nightly   Yes Historical Provider, MD  SEROQUEL XR 400 MG 24 hr tablet Take 400 mg by mouth at bedtime. Takes along with 200 mg tablet to = 600 mg nightly 09/12/14  Yes Historical Provider, MD  medroxyPROGESTERone (PROVERA) 10 MG tablet Take 10 mg by mouth daily. For 7 days    Historical Provider, MD   BP 113/73 mmHg  Pulse 78  Temp(Src) 97.8 F (36.6 C)  Resp 18  SpO2 100% Physical Exam  Constitutional: She is oriented to person, place, and time. She appears well-developed and well-nourished. No distress.  HENT:  Head: Normocephalic and atraumatic.  Neck: Normal range of motion. Neck supple.  Cardiovascular: Normal rate and regular rhythm.  Exam reveals no gallop and no friction rub.   No murmur heard. Pulmonary/Chest: Effort normal and breath sounds normal. No respiratory distress. She has no wheezes.  Abdominal: Soft. Bowel sounds are normal. She exhibits no distension. There is no tenderness.  Musculoskeletal: Normal range of motion.  Neurological: She is alert and oriented to person, place, and time.  Skin: Skin is warm and dry. She is not diaphoretic.  Nursing note and vitals reviewed.   ED Course  Procedures (including critical care time) Labs Review Labs Reviewed  CBC WITH DIFFERENTIAL - Abnormal; Notable for the following:    Hemoglobin 9.0 (*)    HCT 29.5 (*)    MCV 75.1 (*)    MCH 22.9 (*)    RDW 20.3 (*)    Platelets 126 (*)    Eosinophils Relative 9 (*)    Basophils Relative 2 (*)    All other components within normal limits  COMPREHENSIVE METABOLIC PANEL - Abnormal; Notable for the following:    Albumin 3.3 (*)     Total Bilirubin <0.2 (*)    All other components within normal limits  TROPONIN I    Imaging Review No results found.   Date: 10/09/2014  Rate: 90  Rhythm: normal sinus rhythm  QRS Axis: normal  Intervals: PR prolonged  ST/T Wave abnormalities: normal  Conduction Disutrbances:first degree av block  Narrative Interpretation:   Old EKG Reviewed: none available    MDM   Final diagnoses:  None    Patient presents here after an episode of chest tightness and near syncope that occurred while she was having an IV started in the hematology clinic. She was to have an iron infusion but this did not happen due to this episode. Shortly after arriving in the ER her symptoms spontaneously resolved.  Her workup in the ED reveals a sinus rhythm with first-degree AV block, however otherwise unremarkable EKG and negative troponin. Her electrolytes are unremarkable and hemoglobin is 9. This is what her hemoglobin was when checked last month.  I have spoken with Dr. Bertis RuddyGorsuch who will make arrangements for the patient to be rescheduled for her iron infusion. At this point I feel as though she is appropriate for discharge. I doubt a cardiac etiology and have found no other emergent pathology. Her hemoglobin is unchanged and she is hemodynamically stable.    Geoffery Lyonsouglas Keylah Darwish, MD 10/09/14 [phone]

## 2014-10-09 NOTE — Progress Notes (Signed)
1150: Dr. Bertis RuddyGorsuch notified that pt reporting menstrual bleeding x5 days with the last 3 days of heavy bleeding with clots. Pt states she is changing her pad every 30 minutes or so. Also notified of pt's HR of 110 and BP.  Dr. Bertis RuddyGorsuch states to proceed with feraheme treatment and for pt to follow up with GYN.  Pt verbalized understanding.   1208: pt reports dizziness and left chest pain described as sharp and pressure, beginning with IV start. NS infusion started. BP decreased to 94/55 with HR in the 80s. Everrett Coombeyndee Bacon, NP notified and stat EKG ordered. 1212: BP: 111/64, HR: 91, RR: 20, 100% on RA.  12:15: Everrett Coombeyndee Bacon, NP at bedside; EKG obtained and given to Everrett Coombeyndee Bacon, NP.  Pt reports still feeling dizzy, some sob, pain to left chest worse with deep breath. Vital signs stable. Oxygen at 2L/[place] initiated.   1255: Pt transported via wheelchair to ED room 7.  Report given to Mardella LaymanLindsey, Charity fundraiserN.

## 2014-10-10 ENCOUNTER — Encounter: Payer: Self-pay | Admitting: *Deleted

## 2014-10-10 ENCOUNTER — Telehealth: Payer: Self-pay | Admitting: *Deleted

## 2014-10-10 ENCOUNTER — Encounter: Payer: Self-pay | Admitting: Nurse Practitioner

## 2014-10-10 DIAGNOSIS — R079 Chest pain, unspecified: Secondary | ICD-10-CM | POA: Insufficient documentation

## 2014-10-10 NOTE — Progress Notes (Signed)
SYMPTOM MANAGEMENT CLINIC   HPI: Heidi Baker 38 y.o. female diagnosed with deficiency anemia.  Presented today to receive Feraheme iron infusion.  Patient's chronic iron deficiency anemia is presumed secondary to menorrhagia.  Patient states that she has her period at this present time; and is saturating pads every 30 minutes.  Patient states that she has not yet scheduled her GYN followup.  Patient presented today to the [facility] for a Feraheme infusion.  Prior to initiating the Feraheme-patient developed some left-sided chest pain and shortness of breath.  Blood pressure did decrease somewhat; but O2 sats remained stable.  Patient was placed on O2 via nasal cannula.  EKG obtained revealed a sinus rhythm with a first degree AV block.  Ventricular rate was 86; with a QTC of 442.  Patient was transported to the emergency department for further evaluation of her chest pain.   HPI   ROS  Past Medical History  Diagnosis Date  . Anxiety   . Depression     Past Surgical History  Procedure Laterality Date  . Uterine fibroid surgery  2009    has Post traumatic stress disorder (PTSD); Bipolar I disorder, most recent episode (or current) manic; Cannabis abuse; Iron deficiency anemia; and Chest pain on her problem list.     is allergic to amoxicillin.    Medication List       This list is accurate as of: 10/09/14  1:09 PM.  Always use your most recent med list.               CLOMID 50 MG tablet  Generic drug:  clomiPHENE  Take 100 mg by mouth daily.     PROVERA 10 MG tablet  Generic drug:  medroxyPROGESTERone  Take 10 mg by mouth daily. For 7 days     QUEtiapine 200 MG 24 hr tablet  Commonly known as:  SEROQUEL XR  Take 200 mg by mouth at bedtime. Take along with the 400 mg pill to = 600 mg nightly     SEROQUEL XR 400 MG 24 hr tablet  Generic drug:  QUEtiapine  Take 400 mg by mouth at bedtime. Takes along with 200 mg tablet to = 600 mg nightly          PHYSICAL EXAMINATION Vitals: BP 121/77, HR 79, temp 97.9, sat 100.  Physical Exam  Constitutional: She is oriented to person, place, and time and well-developed, well-nourished, and in no distress. No distress.  HENT:  Head: Normocephalic and atraumatic.  Mouth/Throat: Oropharynx is clear and moist.  Eyes: Conjunctivae and EOM are normal. Pupils are equal, round, and reactive to light. No scleral icterus.  Neck: Normal range of motion. Neck supple. No JVD present. No tracheal deviation present. No thyromegaly present.  Cardiovascular: Normal rate, regular rhythm, normal heart sounds and intact distal pulses.   Pulmonary/Chest: Effort normal and breath sounds normal. No respiratory distress. She has no wheezes. She has no rales.  Abdominal: Soft. Bowel sounds are normal. She exhibits no distension. There is no tenderness. There is no rebound.  Musculoskeletal: Normal range of motion. She exhibits no edema or tenderness.  Lymphadenopathy:    She has no cervical adenopathy.  Neurological: She is alert and oriented to person, place, and time.  Skin: Skin is warm and dry. No rash noted. No erythema.  Psychiatric: Affect normal.  Nursing note and vitals reviewed.   LABORATORY DATA:. Admission on 10/09/2014, Discharged on 10/09/2014  Component Date Value Ref Range Status  .  WBC 10/09/2014 5.4  4.0 - 10.5 K/uL Final  . RBC 10/09/2014 3.93  3.87 - 5.11 MIL/uL Final  . Hemoglobin 10/09/2014 9.0* 12.0 - 15.0 g/dL Final  . HCT 10/09/2014 29.5* 36.0 - 46.0 % Final  . MCV 10/09/2014 75.1* 78.0 - 100.0 fL Final  . MCH 10/09/2014 22.9* 26.0 - 34.0 pg Final  . MCHC 10/09/2014 30.5  30.0 - 36.0 g/dL Final  . RDW 10/09/2014 20.3* 11.5 - 15.5 % Final  . Platelets 10/09/2014 126* 150 - 400 K/uL Final   REPEATED TO VERIFY  . Neutrophils Relative % 10/09/2014 54  43 - 77 % Final  . Neutro Abs 10/09/2014 2.9  1.7 - 7.7 K/uL Final  . Lymphocytes Relative 10/09/2014 26  12 - 46 % Final  . Lymphs Abs  10/09/2014 1.4  0.7 - 4.0 K/uL Final  . Monocytes Relative 10/09/2014 10  3 - 12 % Final  . Monocytes Absolute 10/09/2014 0.5  0.1 - 1.0 K/uL Final  . Eosinophils Relative 10/09/2014 9* 0 - 5 % Final  . Eosinophils Absolute 10/09/2014 0.5  0.0 - 0.7 K/uL Final  . Basophils Relative 10/09/2014 2* 0 - 1 % Final  . Basophils Absolute 10/09/2014 0.1  0.0 - 0.1 K/uL Final  . Sodium 10/09/2014 140  137 - 147 mEq/L Final  . Potassium 10/09/2014 4.6  3.7 - 5.3 mEq/L Final  . Chloride 10/09/2014 109  96 - 112 mEq/L Final  . CO2 10/09/2014 22  19 - 32 mEq/L Final  . Glucose, Bld 10/09/2014 90  70 - 99 mg/dL Final  . BUN 10/09/2014 11  6 - 23 mg/dL Final  . Creatinine, Ser 10/09/2014 0.75  0.50 - 1.10 mg/dL Final  . Calcium 10/09/2014 8.9  8.4 - 10.5 mg/dL Final  . Total Protein 10/09/2014 6.5  6.0 - 8.3 g/dL Final  . Albumin 10/09/2014 3.3* 3.5 - 5.2 g/dL Final  . AST 10/09/2014 27  0 - 37 U/L Final   Comment: NO VISIBLE HEMOLYSIS HEMOLYSIS AT THIS LEVEL MAY AFFECT RESULT   . ALT 10/09/2014 15  0 - 35 U/L Final  . Alkaline Phosphatase 10/09/2014 61  39 - 117 U/L Final  . Total Bilirubin 10/09/2014 <0.2* 0.3 - 1.2 mg/dL Final  . GFR calc non Af Amer 10/09/2014 >90  >90 mL/min Final  . GFR calc Af Amer 10/09/2014 >90  >90 mL/min Final   Comment: (NOTE) The eGFR has been calculated using the CKD EPI equation. This calculation has not been validated in all clinical situations. eGFR's persistently <90 mL/min signify possible Chronic Kidney Disease.   . Anion gap 10/09/2014 9  5 - 15 Final  . Troponin I 10/09/2014 <0.30  <0.30 ng/mL Final   Comment:        Due to the release kinetics of cTnI, a negative result within the first hours of the onset of symptoms does not rule out myocardial infarction with certainty. If myocardial infarction is still suspected, repeat the test at appropriate intervals.    EKG:   BRENETTA, PENNY NL:[id number] 09-Oct-2014 12:18:55 [facility]  ONC ROUTINE RECORD Sinus rhythm with 1st degree A-V block Otherwise normal ECG since last tracing no significant change Confirmed by [facility] MD, Marcello Moores ([id number]) on 10/09/2014 1:18:16 PM Also confirmed by Beau Fanny MD, DOUGLAS ([id number]) on 10/09/2014 2:08:53 PM 20m/s 139mmV 100Hz  8.0 SP2 12SL 237 CID: [id number] Referred by: FRYaakov Guthrieonfirmed By: DOGwenyth BenderD Vent. rate 86 BPM PR interval 214 ms QRS duration  96 ms QT/QTc 370/442 ms P-R-T axes 42 49 53 Sep 24, 1976 (37 yr) Female [facility]: Loc:511 Technician: Test ind  RADIOGRAPHIC STUDIES: No results found.  ASSESSMENT/PLAN:    Iron deficiency anemia: deficiency anemia most likely secondary to significant menorrhagia.  Hemoglobin is currently 9.0; platelet count is 126.  Patient presented today for Feraheme infusion; but developed complaint of chest pain prior to receiving any of the Feraheme infusion.  Will reschedule Feraheme infusion for later this week.  Also, patient has plans to followup with a GYN physician for further evaluation of her chronic menorrhagia.  Chest pain: patient developed chest pain prior to the.  HEENT infusion today.  She was also complaining of some mild shortness of breath and dizziness.  EKG revealed a normal sinus rhythm with a first-degree AV block.  O2 was placed on patient via nasal cannula.  Patient was transported to the emergency department via wheelchair with O2 her cancer Center nurse.  Brief history was called to the emergency department charge nurse as well.    Patient stated understanding of all instructions; and was in agreement with this plan of care. The patient knows to call the clinic with any problems, questions or concerns.   Review/collaboration with Dr. Alvy Bimler regarding all aspects of patient's visit today.   Total time spent with patient was 25 minutes;  with greater than 75 percent of that time spent in face to face counseling regarding her symptoms, review of EKG results, and  coordination of care and follow up.  Disclaimer: This note was dictated with voice recognition software. Similar sounding words can inadvertently be transcribed and may not be corrected upon review.   Drue Second, NP 10/10/2014

## 2014-10-10 NOTE — Telephone Encounter (Signed)
Per staff message from MD I have r/s appt from yesterday

## 2014-10-11 ENCOUNTER — Telehealth: Payer: Self-pay

## 2014-10-11 ENCOUNTER — Telehealth: Payer: Self-pay | Admitting: Hematology and Oncology

## 2014-10-11 NOTE — Telephone Encounter (Signed)
Called pt to f/u on dizzyness and chest pain event on 11/17. Pt says she just has a remaining headache but nothing else. She denied any needs at present. Has appt to receive her feraheme on 11/25

## 2014-10-11 NOTE — Telephone Encounter (Signed)
s.w. pt and confirmed all appts.....pt ok and aware °

## 2014-10-17 ENCOUNTER — Ambulatory Visit (HOSPITAL_BASED_OUTPATIENT_CLINIC_OR_DEPARTMENT_OTHER): Payer: BC Managed Care – PPO

## 2014-10-17 DIAGNOSIS — D509 Iron deficiency anemia, unspecified: Secondary | ICD-10-CM

## 2014-10-17 MED ORDER — DIPHENHYDRAMINE HCL 25 MG PO CAPS
ORAL_CAPSULE | ORAL | Status: AC
  Filled 2014-10-17: qty 1

## 2014-10-17 MED ORDER — FERUMOXYTOL INJECTION 510 MG/17 ML
1020.0000 mg | Freq: Once | INTRAVENOUS | Status: AC
  Administered 2014-10-17: 1020 mg via INTRAVENOUS
  Filled 2014-10-17: qty 34

## 2014-10-17 MED ORDER — DIPHENHYDRAMINE HCL 25 MG PO CAPS
25.0000 mg | ORAL_CAPSULE | Freq: Once | ORAL | Status: AC
  Administered 2014-10-17: 25 mg via ORAL

## 2014-10-17 MED ORDER — SODIUM CHLORIDE 0.9 % IV SOLN
Freq: Once | INTRAVENOUS | Status: AC
  Administered 2014-10-17: 10:00:00 via INTRAVENOUS

## 2014-10-17 NOTE — Patient Instructions (Signed)

## 2014-10-17 NOTE — Progress Notes (Signed)
1025: Feraheme infusion complete. Pt reports that she is having some coughing and phlegm, feels congested. States she did not feel this way before feraheme, but states she was "spitting up some phlegm" earlier this morning.  Denies chest tightness, sob, or difficulty swallowing. Vital signs are stable. Normal saline running at this time. Diane, RN auscultated breath sounds and pt lungs sound clear.  Will continue to monitor patient.  1100: Pt still complaining of some cough and congestion. Dr. Bertis RuddyGorsuch notified. States to give pt 25 mg of Benadryl PO and okay to discharge patient home.  1111: Pt discharged in no acute distress, ambulatory with husband.

## 2014-11-06 ENCOUNTER — Other Ambulatory Visit (HOSPITAL_BASED_OUTPATIENT_CLINIC_OR_DEPARTMENT_OTHER): Payer: BC Managed Care – PPO

## 2014-11-06 DIAGNOSIS — D509 Iron deficiency anemia, unspecified: Secondary | ICD-10-CM

## 2014-11-06 LAB — CBC & DIFF AND RETIC
BASO%: 2.4 % — ABNORMAL HIGH (ref 0.0–2.0)
BASOS ABS: 0.1 10*3/uL (ref 0.0–0.1)
EOS ABS: 0.3 10*3/uL (ref 0.0–0.5)
EOS%: 6.1 % (ref 0.0–7.0)
HCT: 33.7 % — ABNORMAL LOW (ref 34.8–46.6)
HGB: 10.6 g/dL — ABNORMAL LOW (ref 11.6–15.9)
Immature Retic Fract: 19 % — ABNORMAL HIGH (ref 1.60–10.00)
LYMPH%: 32.9 % (ref 14.0–49.7)
MCH: 25.4 pg (ref 25.1–34.0)
MCHC: 31.5 g/dL (ref 31.5–36.0)
MCV: 80.8 fL (ref 79.5–101.0)
MONO#: 0.7 10*3/uL (ref 0.1–0.9)
MONO%: 13.2 % (ref 0.0–14.0)
NEUT%: 45.4 % (ref 38.4–76.8)
NEUTROS ABS: 2.2 10*3/uL (ref 1.5–6.5)
PLATELETS: 392 10*3/uL (ref 145–400)
RBC: 4.17 10*6/uL (ref 3.70–5.45)
RDW: 27.3 % — AB (ref 11.2–14.5)
Retic %: 1.99 % (ref 0.70–2.10)
Retic Ct Abs: 82.98 10*3/uL (ref 33.70–90.70)
WBC: 4.9 10*3/uL (ref 3.9–10.3)
lymph#: 1.6 10*3/uL (ref 0.9–3.3)

## 2014-11-07 LAB — IRON AND TIBC CHCC
%SAT: 12 % — ABNORMAL LOW (ref 21–57)
Iron: 46 ug/dL (ref 41–142)
TIBC: 374 ug/dL (ref 236–444)
UIBC: 328 ug/dL (ref 120–384)

## 2014-11-07 LAB — FERRITIN CHCC: FERRITIN: 82 ng/mL (ref 9–269)

## 2014-11-08 ENCOUNTER — Telehealth: Payer: Self-pay | Admitting: Hematology and Oncology

## 2014-11-08 ENCOUNTER — Ambulatory Visit (HOSPITAL_BASED_OUTPATIENT_CLINIC_OR_DEPARTMENT_OTHER): Payer: BC Managed Care – PPO | Admitting: Hematology and Oncology

## 2014-11-08 ENCOUNTER — Encounter: Payer: Self-pay | Admitting: Hematology and Oncology

## 2014-11-08 ENCOUNTER — Telehealth: Payer: Self-pay | Admitting: *Deleted

## 2014-11-08 VITALS — BP 135/91 | HR 86 | Temp 98.2°F | Resp 18 | Ht 66.0 in | Wt 274.8 lb

## 2014-11-08 DIAGNOSIS — N921 Excessive and frequent menstruation with irregular cycle: Secondary | ICD-10-CM | POA: Insufficient documentation

## 2014-11-08 DIAGNOSIS — D509 Iron deficiency anemia, unspecified: Secondary | ICD-10-CM

## 2014-11-08 NOTE — Progress Notes (Signed)
[facility] OFFICE PROGRESS NOTE  Redmond BasemanWONG,FRANCIS PATRICK, MD SUMMARY OF HEMATOLOGIC HISTORY:  The patient had back or history of menorrhagia for many years and had received iron infusion in 2009. She had heavy menstruation at one point, lasting 20 days. Recently, she is undergoing fertility treatment and had no periods for 2 months.  She was found to have abnormal CBC from recent blood work. Her CBC from 06/21/2013 was low at 11.2. On 09/17/2014, her hemoglobin has dropped and 9 with MCV of 72.7. Iron studies confirmed iron deficiency anemia. She denies recent chest pain on exertion, but they complain ofshortness of breath on minimal exertion, pre-syncopal episodes, palpitations and leg cramps. She had not noticed any recent bleeding such as epistaxis, hematuria or hematochezia The patient denies over the counter NSAID ingestion. She is not on antiplatelets agents.  She had no prior history or diagnosis of cancer. Her age appropriate screening programs are up-to-date. She has significant pica with chewing of ice. She eats a variety of diet. She never donated blood or received blood transfusion The patient was prescribed oral iron supplements and she takes 2-3 times a day with meals for the past 1 week. On 10/19/2014, she received 1 dose of iron Feraheme. INTERVAL HISTORY: Heidi Baker 38 y.o. female returns for further follow-up. She complained of persistent fatigue. She has persistent menorrhagia. She is trying to get pregnant. She denies infusion reaction  I have reviewed the past medical history, past surgical history, social history and family history with the patient and they are unchanged from previous note.  ALLERGIES:  is allergic to amoxicillin.  MEDICATIONS:  Current Outpatient Prescriptions  Medication Sig Dispense Refill  . letrozole (FEMARA) 2.5 MG tablet Take 5 mg by mouth daily. Take on days 3 thru 7 of menstrual cycle    . QUEtiapine (SEROQUEL XR) 300 MG  24 hr tablet Take 600 mg by mouth at bedtime.     No current facility-administered medications for this visit.     REVIEW OF SYSTEMS:   Constitutional: Denies fevers, chills or night sweats Eyes: Denies blurriness of vision Ears, nose, mouth, throat, and face: Denies mucositis or sore throat Respiratory: Denies cough, dyspnea or wheezes Cardiovascular: Denies palpitation, chest discomfort or lower extremity swelling Gastrointestinal:  Denies nausea, heartburn or change in bowel habits Skin: Denies abnormal skin rashes Lymphatics: Denies new lymphadenopathy or easy bruising Neurological:Denies numbness, tingling or new weaknesses Behavioral/Psych: Mood is stable, no new changes  All other systems were reviewed with the patient and are negative.  PHYSICAL EXAMINATION: ECOG PERFORMANCE STATUS: 0 - Asymptomatic  Filed Vitals:   11/08/14 1528  BP: 135/91  Pulse: 86  Temp: 98.2 F (36.8 C)  Resp: 18   Filed Weights   11/08/14 1528  Weight: 274 lb 12.8 oz (124.648 kg)    GENERAL:alert, no distress and comfortable. She is morbidly obese SKIN: skin color, texture, turgor are normal, no rashes or significant lesions EYES: normal, Conjunctiva are pink and non-injected, sclera clear Musculoskeletal:no cyanosis of digits and no clubbing  NEURO: alert & oriented x 3 with fluent speech, no focal motor/sensory deficits  LABORATORY DATA:  I have reviewed the data as listed No results found for this or any previous visit (from the past 48 hour(s)).  Lab Results  Component Value Date   WBC 4.9 11/06/2014   HGB 10.6* 11/06/2014   HCT 33.7* 11/06/2014   MCV 80.8 11/06/2014   PLT 392 11/06/2014    ASSESSMENT & PLAN:  Iron  deficiency anemia She has suboptimal response with 1 dose of IV iron. She has persistent excessive menorrhagia. I recommend repeat iron infusion in January. I recommend prenatal vitamin to provide extra folic acid and vitamin B-12 for effective for  erythropoiesis  Menorrhagia with irregular cycle This is the cause of her severe, recurrent iron deficiency anemia. The patient desire to get pregnant. As of right now, she would like to continue iron infusion support.   All questions were answered. The patient knows to call the clinic with any problems, questions or concerns. No barriers to learning was detected.  I spent 25 minutes counseling the patient face to face. The total time spent in the appointment was 30 minutes and more than 50% was on counseling.     [facility] HospitalGORSUCH, Janeka Libman, MD 11/08/2014 4:26 PM

## 2014-11-08 NOTE — Assessment & Plan Note (Signed)
She has suboptimal response with 1 dose of IV iron. She has persistent excessive menorrhagia. I recommend repeat iron infusion in January. I recommend prenatal vitamin to provide extra folic acid and vitamin B-12 for effective for erythropoiesis

## 2014-11-08 NOTE — Telephone Encounter (Signed)
Pt left VM asking if she is supposed to keep her appt w/ Dr. Bertis RuddyGorsuch this afternoon at 3 pm?  She thought it would be contigent on her labwork but she is unsure of her results?

## 2014-11-08 NOTE — Telephone Encounter (Signed)
Pt confirmed labs/ov per 12/17 POF, gave pt AVS.... KJ, sent msg to add IV Feraheme, pt is a Engineer, siteschool teacher and wants afternoon apts if possible unless she is off. Pt is off on 11/26/2014 so she doesn't mind an am apt.

## 2014-11-08 NOTE — Assessment & Plan Note (Signed)
This is the cause of her severe, recurrent iron deficiency anemia. The patient desire to get pregnant. As of right now, she would like to continue iron infusion support.

## 2014-11-08 NOTE — Telephone Encounter (Signed)
Informed pt she does need to keep her appt today for her anemia.  She verbalized understanding and will keep appt as scheduled.

## 2014-11-09 ENCOUNTER — Telehealth: Payer: Self-pay | Admitting: *Deleted

## 2014-11-09 NOTE — Telephone Encounter (Signed)
Per staff message and POF I have scheduled appts. Advised scheduler of appts. JMW  

## 2014-11-26 ENCOUNTER — Encounter: Payer: Self-pay | Admitting: Nurse Practitioner

## 2014-11-26 ENCOUNTER — Ambulatory Visit (HOSPITAL_BASED_OUTPATIENT_CLINIC_OR_DEPARTMENT_OTHER): Payer: BC Managed Care – PPO

## 2014-11-26 ENCOUNTER — Ambulatory Visit (HOSPITAL_BASED_OUTPATIENT_CLINIC_OR_DEPARTMENT_OTHER): Payer: BC Managed Care – PPO | Admitting: Nurse Practitioner

## 2014-11-26 DIAGNOSIS — R21 Rash and other nonspecific skin eruption: Secondary | ICD-10-CM

## 2014-11-26 DIAGNOSIS — R0981 Nasal congestion: Secondary | ICD-10-CM

## 2014-11-26 DIAGNOSIS — D509 Iron deficiency anemia, unspecified: Secondary | ICD-10-CM

## 2014-11-26 DIAGNOSIS — T7840XA Allergy, unspecified, initial encounter: Secondary | ICD-10-CM | POA: Insufficient documentation

## 2014-11-26 DIAGNOSIS — R0989 Other specified symptoms and signs involving the circulatory and respiratory systems: Secondary | ICD-10-CM

## 2014-11-26 MED ORDER — DIPHENHYDRAMINE HCL 50 MG/ML IJ SOLN
INTRAMUSCULAR | Status: AC
  Filled 2014-11-26: qty 1

## 2014-11-26 MED ORDER — SODIUM CHLORIDE 0.9 % IV SOLN
Freq: Once | INTRAVENOUS | Status: AC
  Administered 2014-11-26: 09:00:00 via INTRAVENOUS

## 2014-11-26 MED ORDER — HEPARIN SOD (PORK) LOCK FLUSH 100 UNIT/ML IV SOLN
500.0000 [IU] | Freq: Once | INTRAVENOUS | Status: DC | PRN
  Filled 2014-11-26: qty 5

## 2014-11-26 MED ORDER — SODIUM CHLORIDE 0.9 % IJ SOLN
3.0000 mL | Freq: Once | INTRAMUSCULAR | Status: DC | PRN
  Filled 2014-11-26: qty 10

## 2014-11-26 MED ORDER — DIPHENHYDRAMINE HCL 50 MG/ML IJ SOLN
25.0000 mg | Freq: Once | INTRAMUSCULAR | Status: AC
  Administered 2014-11-26: 25 mg via INTRAVENOUS

## 2014-11-26 MED ORDER — METHYLPREDNISOLONE SODIUM SUCC 125 MG IJ SOLR
125.0000 mg | Freq: Once | INTRAMUSCULAR | Status: AC
  Administered 2014-11-26: 125 mg via INTRAVENOUS

## 2014-11-26 MED ORDER — SODIUM CHLORIDE 0.9 % IJ SOLN
10.0000 mL | INTRAMUSCULAR | Status: DC | PRN
  Filled 2014-11-26: qty 10

## 2014-11-26 MED ORDER — FAMOTIDINE IN NACL 20-0.9 MG/50ML-% IV SOLN
20.0000 mg | Freq: Once | INTRAVENOUS | Status: AC
  Administered 2014-11-26: 20 mg via INTRAVENOUS

## 2014-11-26 MED ORDER — SODIUM CHLORIDE 0.9 % IV SOLN
510.0000 mg | Freq: Once | INTRAVENOUS | Status: AC
  Administered 2014-11-26: 510 mg via INTRAVENOUS
  Filled 2014-11-26: qty 17

## 2014-11-26 MED ORDER — ALTEPLASE 2 MG IJ SOLR
2.0000 mg | Freq: Once | INTRAMUSCULAR | Status: DC | PRN
  Filled 2014-11-26: qty 2

## 2014-11-26 MED ORDER — HEPARIN SOD (PORK) LOCK FLUSH 100 UNIT/ML IV SOLN
250.0000 [IU] | Freq: Once | INTRAVENOUS | Status: DC | PRN
  Filled 2014-11-26: qty 5

## 2014-11-26 NOTE — Progress Notes (Signed)
Pt c/o itching eyes and congestion in nose, and red rash on left lower arm, right above IV site at end of Feraheme infusion.  Leilani Merl NP notified and is present at pt's chair side.  Benadryl  IV given at 0943.  Solumedrol  given at 0950.  Pepcid  given at 0955.   Eyes less itching but still has nose congestion.  Benadryl 25 mg given IVP at 1025.  By 1100 pt denies any itching or congestion.  Leilani Merl NP present and gives ok to discharge home.

## 2014-11-26 NOTE — Progress Notes (Signed)
will   SYMPTOM MANAGEMENT CLINIC   HPI: Heidi Baker 39 y.o. female diagnosed with iron deficiency anemia.  Currently undergoing Feraheme therapy.  Patient presented to the cancer Center today to receive her second Feraheme infusion for iron deficiency anemia.  Patient reports that she did experience some mild nasal congestion following her first Feraheme infusion.  Following the completion of her Feraheme infusion today-patient did develop some nasal congestion, some itching and watering eyes, a scratchy throat, and some mild hives to her left arm.  Patient was given Benadryl 25 mg IV, Pepcid 20 mg IV, and Solu-Medrol 125 mg IV per hypersensitivity protocol.  All symptoms did slowly improve; and patient was allowed to be discharged home with family.  HPI  ROS  Past Medical History  Diagnosis Date  . Anxiety   . Depression     Past Surgical History  Procedure Laterality Date  . Uterine fibroid surgery  2009    has Post traumatic stress disorder (PTSD); Bipolar I disorder, most recent episode (or current) manic; Cannabis abuse; Iron deficiency anemia; Chest pain; Menorrhagia with irregular cycle; and Hypersensitivity reaction on her problem list.     is allergic to amoxicillin.    Medication List       This list is accurate as of: 11/26/14  6:12 PM.  Always use your most recent med list.               FEMARA 2.5 MG tablet  Generic drug:  letrozole  Take 5 mg by mouth daily. Take on days 3 thru 7 of menstrual cycle     SEROQUEL XR 300 MG 24 hr tablet  Generic drug:  QUEtiapine  Take 600 mg by mouth at bedtime.         PHYSICAL EXAMINATION  Vitals: 118/80, HR 78, temp 99.8  Physical Exam  Constitutional: She is oriented to person, place, and time. She appears unhealthy.  HENT:  Head: Normocephalic and atraumatic.  Mouth/Throat: No oropharyngeal exudate.  On initial exam-patient was experiencing some nasal congestion and a mild scratchy throat.  There was no  obvious watering of eyes.  The symptoms did improve with hypersensitivity protocol medications.  Eyes: Conjunctivae and EOM are normal. Pupils are equal, round, and reactive to light. Right eye exhibits no discharge. Left eye exhibits no discharge. No scleral icterus.  Neck: Normal range of motion. Neck supple. No JVD present.  Cardiovascular: Normal rate, regular rhythm, normal heart sounds and intact distal pulses.   Pulmonary/Chest: Effort normal. No stridor. No respiratory distress.  Abdominal: Soft. Bowel sounds are normal.  Musculoskeletal: Normal range of motion.  Neurological: She is alert and oriented to person, place, and time. Gait normal.  Skin: Skin is warm and dry. Rash noted. No erythema.  Trace rash/hives to left forearm only.  Rash did improve with hypersensitivity protocol medications.  Psychiatric: Affect normal.  Nursing note and vitals reviewed.   LABORATORY DATA:. No visits with results within 3 Day(s) from this visit. Latest known visit with results is:  Appointment on 11/06/2014  Component Date Value Ref Range Status  . WBC 11/06/2014 4.9  3.9 - 10.3 10e3/uL Final  . NEUT# 11/06/2014 2.2  1.5 - 6.5 10e3/uL Final  . HGB 11/06/2014 10.6* 11.6 - 15.9 g/dL Final  . HCT 69/62/9528 33.7* 34.8 - 46.6 % Final  . Platelets 11/06/2014 392  145 - 400 10e3/uL Final  . MCV 11/06/2014 80.8  79.5 - 101.0 fL Final  . MCH 11/06/2014 25.4  25.1 -  34.0 pg Final  . MCHC 11/06/2014 31.5  31.5 - 36.0 g/dL Final  . RBC 16/08/9603 4.17  3.70 - 5.45 10e6/uL Final  . RDW 11/06/2014 27.3* 11.2 - 14.5 % Final  . lymph# 11/06/2014 1.6  0.9 - 3.3 10e3/uL Final  . MONO# 11/06/2014 0.7  0.1 - 0.9 10e3/uL Final  . Eosinophils Absolute 11/06/2014 0.3  0.0 - 0.5 10e3/uL Final  . Basophils Absolute 11/06/2014 0.1  0.0 - 0.1 10e3/uL Final  . NEUT% 11/06/2014 45.4  38.4 - 76.8 % Final  . LYMPH% 11/06/2014 32.9  14.0 - 49.7 % Final  . MONO% 11/06/2014 13.2  0.0 - 14.0 % Final  . EOS% 11/06/2014  6.1  0.0 - 7.0 % Final  . BASO% 11/06/2014 2.4* 0.0 - 2.0 % Final  . Retic % 11/06/2014 1.99  0.70 - 2.10 % Final  . Retic Ct Abs 11/06/2014 82.98  33.70 - 90.70 10e3/uL Final  . Immature Retic Fract 11/06/2014 19.00* 1.60 - 10.00 % Final  . Iron 11/06/2014 46  41 - 142 ug/dL Final  . TIBC 54/07/8118 374  236 - 444 ug/dL Final  . UIBC 14/78/2956 328  120 - 384 ug/dL Final  . %SAT 21/30/8657 12* 21 - 57 % Final  . Ferritin 11/06/2014 82  9 - 269 ng/ml Final     RADIOGRAPHIC STUDIES: No results found.  ASSESSMENT/PLAN:    Hypersensitivity reaction Patient had completed her Feraheme infusion; and then developed some mild nasal congestion, eye watering and itching, scratchy throat, and mild rash to left arm.  Patient was given no premeds prior to her Feraheme infusion today.  Patient received Benadryl 25 mg IV, Pepcid 20 mg IV, and Solu-Medrol 125 mg IV per hypersensitivity protocol.  Patient was monitored for approximately 45 minutes; with all symptoms greatly improved.  Patient then insisted on going home with family.  Patient was advised to continue with Benadryl 25 mg every 6 hours and Pepcid 20 mg every 12 hours until tomorrow.  Also, advised patient and her family member to go directly to the emergency department if she develops any worsening symptoms overnight whatsoever.  Iron deficiency anemia Patient received her second Feraheme infusion today.  Patient states that she did experience some mild nasal congestion with her first Feraheme infusion.  She had completed her Feraheme infusion today; and experienced some nasal congestion, eye watering and itching, scratchy throat, and trace of rash.  Airway remained intact; and patient managing all secretions with no difficulty.  Vital signs remained stable.  Also patient's reaction symptoms were managed per hypersensitivity protocol.  Patient is scheduled for her next feraheme infusion on 12/03/2014.  Due to hypersensitivity reaction  symptoms-will most likely need to consider switching patient's iron deficiency anemia therapy plan.  Patient stated understanding of all instructions; and was in agreement with this plan of care. The patient knows to call the clinic with any problems, questions or concerns.   Review/collaboration with Dr. Bertis Ruddy regarding all aspects of patient's visit today.   Total time spent with patient was 40 minutes;  with greater than 75 percent of that time spent in face to face counseling regarding her symptoms, frequent monitoring while in the infusion Center, and coordination of care and follow up.  Disclaimer: This note was dictated with voice recognition software. Similar sounding words can inadvertently be transcribed and may not be corrected upon review.   Payton Mccallum, NP 11/26/2014

## 2014-11-26 NOTE — Assessment & Plan Note (Addendum)
Patient had completed her Feraheme infusion; and then developed some mild nasal congestion, eye watering and itching, scratchy throat, and mild rash to left arm.  Patient was given no premeds prior to her Feraheme infusion today.  Patient received Benadryl 25 mg IV, Pepcid 20 mg IV, and Solu-Medrol 125 mg IV per hypersensitivity protocol.  Patient was monitored for approximately 45 minutes; with all symptoms greatly improved.  Patient then insisted on going home with family.  Patient was advised to continue with Benadryl 25 mg every 6 hours and Pepcid 20 mg every 12 hours until tomorrow.  Also, advised patient and her family member to go directly to the emergency department if she develops any worsening symptoms overnight whatsoever.

## 2014-11-26 NOTE — Addendum Note (Signed)
Addended by: Mirian Capuchin on: 11/26/2014 05:31 PM   Modules accepted: Orders

## 2014-11-26 NOTE — Patient Instructions (Signed)
Iron Deficiency Anemia Anemia is a condition in which there are less red blood cells or hemoglobin in the blood than normal. Hemoglobin is the part of red blood cells that carries oxygen. Iron deficiency anemia is anemia caused by too little iron. It is the most common type of anemia. It may leave you tired and short of breath. CAUSES   Lack of iron in the diet.  Poor absorption of iron, as seen with intestinal disorders.  Intestinal bleeding.  Heavy periods. SIGNS AND SYMPTOMS  Mild anemia may not be noticeable. Symptoms may include:  Fatigue.  Headache.  Pale skin.  Weakness.  Tiredness.  Shortness of breath.  Dizziness.  Cold hands and feet.  Fast or irregular heartbeat. DIAGNOSIS  Diagnosis requires a thorough evaluation and physical exam by your health care provider. Blood tests are generally used to confirm iron deficiency anemia. Additional tests may be done to find the underlying cause of your anemia. These may include:  Testing for blood in the stool (fecal occult blood test).  A procedure to see inside the colon and rectum (colonoscopy).  A procedure to see inside the esophagus and stomach (endoscopy). TREATMENT  Iron deficiency anemia is treated by correcting the cause of the deficiency. Treatment may involve:  Adding iron-rich foods to your diet.  Taking iron supplements. Pregnant or breastfeeding women need to take extra iron because their normal diet usually does not provide the required amount.  Taking vitamins. Vitamin C improves the absorption of iron. Your health care provider may recommend that you take your iron tablets with a glass of orange juice or vitamin C supplement.  Medicines to make heavy menstrual flow lighter.  Surgery. HOME CARE INSTRUCTIONS   Take iron as directed by your health care provider.  If you cannot tolerate taking iron supplements by mouth, talk to your health care provider about taking them through a vein  (intravenously) or an injection into a muscle.  For the best iron absorption, iron supplements should be taken on an empty stomach. If you cannot tolerate them on an empty stomach, you may need to take them with food.  Do not drink milk or take antacids at the same time as your iron supplements. Milk and antacids may interfere with the absorption of iron.  Iron supplements can cause constipation. Make sure to include fiber in your diet to prevent constipation. A stool softener may also be recommended.  Take vitamins as directed by your health care provider.  Eat a diet rich in iron. Foods high in iron include liver, lean beef, whole-grain bread, eggs, dried fruit, and dark green leafy vegetables. SEEK IMMEDIATE MEDICAL CARE IF:   You faint. If this happens, do not drive. Call your local emergency services (911 in [place] if no other help is available.  You have chest pain.  You feel nauseous or vomit.  You have severe or increased shortness of breath with activity.  You feel weak.  You have a rapid heartbeat.  You have unexplained sweating.  You become light-headed when getting up from a chair or bed. MAKE SURE YOU:   Understand these instructions.  Will watch your condition.  Will get help right away if you are not doing well or get worse. Document Released: 11/06/2000 Document Revised: 11/14/2013 Document Reviewed: 07/17/2013 Exit[employer] Patient Information 2015 [employer]. This information is not intended to replace advice given to you by your health care provider. Make sure you discuss any questions you have with your health care provider.       Ferumoxytol injection What is this medicine? FERUMOXYTOL is an iron complex. Iron is used to make healthy red blood cells, which carry oxygen and nutrients throughout the body. This medicine is used to treat iron deficiency anemia in people with chronic kidney disease. This medicine may be used for other purposes; ask your health  care provider or pharmacist if you have questions. COMMON BRAND NAME(S): Feraheme What should I tell my health care provider before I take this medicine? They need to know if you have any of these conditions: -anemia not caused by low iron levels -high levels of iron in the blood -magnetic resonance imaging (MRI) test scheduled -an unusual or allergic reaction to iron, other medicines, foods, dyes, or preservatives -pregnant or trying to get pregnant -breast-feeding How should I use this medicine? This medicine is for injection into a vein. It is given by a health care professional in a hospital or clinic setting. Talk to your pediatrician regarding the use of this medicine in children. Special care may be needed. Overdosage: If you think you've taken too much of this medicine contact a poison control center or emergency room at once. Overdosage: If you think you have taken too much of this medicine contact a poison control center or emergency room at once. NOTE: This medicine is only for you. Do not share this medicine with others. What if I miss a dose? It is important not to miss your dose. Call your doctor or health care professional if you are unable to keep an appointment. What may interact with this medicine? This medicine may interact with the following medications: -other iron products This list may not describe all possible interactions. Give your health care provider a list of all the medicines, herbs, non-prescription drugs, or dietary supplements you use. Also tell them if you smoke, drink alcohol, or use illegal drugs. Some items may interact with your medicine. What should I watch for while using this medicine? Visit your doctor or healthcare professional regularly. Tell your doctor or healthcare professional if your symptoms do not start to get better or if they get worse. You may need blood work done while you are taking this medicine. You may need to follow a special diet.  Talk to your doctor. Foods that contain iron include: whole grains/cereals, dried fruits, beans, or peas, leafy green vegetables, and organ meats (liver, kidney). What side effects may I notice from receiving this medicine? Side effects that you should report to your doctor or health care professional as soon as possible: -allergic reactions like skin rash, itching or hives, swelling of the face, lips, or tongue -breathing problems -changes in blood pressure -feeling faint or lightheaded, falls -fever or chills -flushing, sweating, or hot feelings -swelling of the ankles or feet Side effects that usually do not require medical attention (Report these to your doctor or health care professional if they continue or are bothersome.): -diarrhea -headache -nausea, vomiting -stomach pain This list may not describe all possible side effects. Call your doctor for medical advice about side effects. You may report side effects to FDA at 1-800-FDA-1088. Where should I keep my medicine? This drug is given in a hospital or clinic and will not be stored at home. NOTE: This sheet is a summary. It may not cover all possible information. If you have questions about this medicine, talk to your doctor, pharmacist, or health care provider.  2015, Elsevier/Gold Standard. (2012-06-24 15:23:36)   

## 2014-11-26 NOTE — Assessment & Plan Note (Signed)
Patient received her second Feraheme infusion today.  Patient states that she did experience some mild nasal congestion with her first Feraheme infusion.  She had completed her Feraheme infusion today; and experienced some nasal congestion, eye watering and itching, scratchy throat, and trace of rash.  Airway remained intact; and patient managing all secretions with no difficulty.  Vital signs remained stable.  Also patient's reaction symptoms were managed per hypersensitivity protocol.  Patient is scheduled for her next feraheme infusion on 12/03/2014.  Due to hypersensitivity reaction symptoms-will most likely need to consider switching patient's iron deficiency anemia therapy plan.

## 2014-11-27 ENCOUNTER — Telehealth: Payer: Self-pay | Admitting: Hematology and Oncology

## 2014-11-27 ENCOUNTER — Other Ambulatory Visit: Payer: Self-pay | Admitting: Hematology and Oncology

## 2014-11-27 ENCOUNTER — Telehealth: Payer: Self-pay

## 2014-11-27 NOTE — Telephone Encounter (Signed)
Left a message for Ms. Sheriff to [facility] to let Payton MccallumCynthia Bacon know how she is doing today as she had a reaction to the iron infusion she received yesterday.

## 2014-11-27 NOTE — Telephone Encounter (Signed)
-----   Message from Payton Mccallumynthia Bacon, NP sent at 11/26/2014  6:18 PM EST ----- PROVIDER:  Lorain ChildesFYI ONLY Triage: follow up call 24-48 hours please.

## 2014-11-27 NOTE — Telephone Encounter (Signed)
I reviewed to documentation by Everrett Coombeyndee Bacon. I noted recurrent allergic reaction to Feraheme. I will touch switch her treatment from Feraheme to Venofer

## 2014-11-28 ENCOUNTER — Telehealth: Payer: Self-pay | Admitting: *Deleted

## 2014-11-28 NOTE — Telephone Encounter (Signed)
Left VM for pt to return nurse's call to confirm appt on Monday 1/11 and to inform her of changes.

## 2014-11-28 NOTE — Telephone Encounter (Signed)
-----   Message from Artis DelayNi Gorsuch, MD sent at 11/27/2014  1:04 PM EST ----- Cameo,  Please call patient to let her know I'm switching her iv iron to a different formula due to recurrent allergic reaction. We should be able to give it at her prescheduled appt ----- Message -----    From: Payton Mccallumynthia Bacon, NP    Sent: 11/26/2014   6:19 PM      To: Artis DelayNi Gorsuch, MD  Pt is scheduled for her next Feraheme infusion on 1/11.    Just a reminder that you wanted to switch her tx plan due to reaction issue!  Cyndee

## 2014-11-30 ENCOUNTER — Telehealth: Payer: Self-pay | Admitting: *Deleted

## 2014-11-30 NOTE — Telephone Encounter (Signed)
Informed pt of iron formula changed to Venofer since she had reaction to Feraheme.  Informed of new appt time and length.  Arrive at 8:30 am on Monday 1/11. She verbalized understanding.

## 2014-12-03 ENCOUNTER — Other Ambulatory Visit: Payer: Self-pay | Admitting: *Deleted

## 2014-12-03 ENCOUNTER — Telehealth: Payer: Self-pay | Admitting: *Deleted

## 2014-12-03 ENCOUNTER — Ambulatory Visit (HOSPITAL_BASED_OUTPATIENT_CLINIC_OR_DEPARTMENT_OTHER): Payer: BC Managed Care – PPO

## 2014-12-03 DIAGNOSIS — D509 Iron deficiency anemia, unspecified: Secondary | ICD-10-CM

## 2014-12-03 MED ORDER — METHYLPREDNISOLONE SODIUM SUCC 40 MG IJ SOLR
40.0000 mg | Freq: Once | INTRAMUSCULAR | Status: AC
  Administered 2014-12-03: 40 mg via INTRAVENOUS

## 2014-12-03 MED ORDER — DIPHENHYDRAMINE HCL 25 MG PO CAPS
ORAL_CAPSULE | ORAL | Status: AC
  Filled 2014-12-03: qty 1

## 2014-12-03 MED ORDER — DIPHENHYDRAMINE HCL 25 MG PO TABS
25.0000 mg | ORAL_TABLET | Freq: Once | ORAL | Status: AC
  Administered 2014-12-03: 25 mg via ORAL
  Filled 2014-12-03: qty 1

## 2014-12-03 MED ORDER — METHYLPREDNISOLONE SODIUM SUCC 40 MG IJ SOLR
INTRAMUSCULAR | Status: AC
  Filled 2014-12-03: qty 1

## 2014-12-03 MED ORDER — SODIUM CHLORIDE 0.9 % IV SOLN
Freq: Once | INTRAVENOUS | Status: AC
  Administered 2014-12-03: 10:00:00 via INTRAVENOUS

## 2014-12-03 MED ORDER — SODIUM CHLORIDE 0.9 % IV SOLN
200.0000 mg | Freq: Once | INTRAVENOUS | Status: AC
  Administered 2014-12-03: 200 mg via INTRAVENOUS
  Filled 2014-12-03: qty 10

## 2014-12-03 NOTE — Patient Instructions (Signed)

## 2014-12-03 NOTE — Telephone Encounter (Signed)
Patient called reporting discomfort to today's first attempted IV site.  Reports first attempt was not successful and tender as a nerve may have been touched with attempt for vein.  Has now noticed a bump the size of a dime but not quite the size of a nickel that is tender and green in color.  What should I do now?"    This nurse instructed to apply ice pack and take tylenol or ibuprofen.

## 2014-12-06 ENCOUNTER — Encounter: Payer: Self-pay | Admitting: *Deleted

## 2014-12-06 ENCOUNTER — Telehealth: Payer: Self-pay | Admitting: *Deleted

## 2014-12-06 NOTE — Telephone Encounter (Signed)
I have called and moved appts for 2/22 to earlier due to the treatment.

## 2014-12-07 ENCOUNTER — Telehealth: Payer: Self-pay | Admitting: *Deleted

## 2014-12-07 NOTE — Telephone Encounter (Signed)
Faxed letter to work per pt request

## 2015-01-01 ENCOUNTER — Telehealth: Payer: Self-pay | Admitting: Hematology and Oncology

## 2015-01-01 NOTE — Telephone Encounter (Signed)
returned call and s.w. pt and confirmed appt...MD has no appt available later in week

## 2015-01-07 ENCOUNTER — Other Ambulatory Visit (HOSPITAL_BASED_OUTPATIENT_CLINIC_OR_DEPARTMENT_OTHER): Payer: BC Managed Care – PPO

## 2015-01-07 DIAGNOSIS — D509 Iron deficiency anemia, unspecified: Secondary | ICD-10-CM

## 2015-01-07 LAB — CBC & DIFF AND RETIC
BASO%: 0.7 % (ref 0.0–2.0)
BASOS ABS: 0 10*3/uL (ref 0.0–0.1)
EOS ABS: 0.2 10*3/uL (ref 0.0–0.5)
EOS%: 5.6 % (ref 0.0–7.0)
HCT: 42.9 % (ref 34.8–46.6)
HGB: 13.9 g/dL (ref 11.6–15.9)
Immature Retic Fract: 7.3 % (ref 1.60–10.00)
LYMPH#: 1.8 10*3/uL (ref 0.9–3.3)
LYMPH%: 42.8 % (ref 14.0–49.7)
MCH: 28.3 pg (ref 25.1–34.0)
MCHC: 32.4 g/dL (ref 31.5–36.0)
MCV: 87.2 fL (ref 79.5–101.0)
MONO#: 0.5 10*3/uL (ref 0.1–0.9)
MONO%: 11.2 % (ref 0.0–14.0)
NEUT#: 1.6 10*3/uL (ref 1.5–6.5)
NEUT%: 39.7 % (ref 38.4–76.8)
NRBC: 0 % (ref 0–0)
Platelets: 180 10*3/uL (ref 145–400)
RBC: 4.92 10*6/uL (ref 3.70–5.45)
RDW: 18.9 % — AB (ref 11.2–14.5)
RETIC CT ABS: 64.45 10*3/uL (ref 33.70–90.70)
Retic %: 1.31 % (ref 0.70–2.10)
WBC: 4.1 10*3/uL (ref 3.9–10.3)

## 2015-01-08 LAB — IRON AND TIBC CHCC
%SAT: 26 % (ref 21–57)
Iron: 79 ug/dL (ref 41–142)
TIBC: 299 ug/dL (ref 236–444)
UIBC: 220 ug/dL (ref 120–384)

## 2015-01-08 LAB — FERRITIN CHCC: Ferritin: 44 ng/ml (ref 9–269)

## 2015-01-14 ENCOUNTER — Ambulatory Visit (HOSPITAL_BASED_OUTPATIENT_CLINIC_OR_DEPARTMENT_OTHER): Payer: BC Managed Care – PPO

## 2015-01-14 ENCOUNTER — Other Ambulatory Visit: Payer: Self-pay | Admitting: Hematology and Oncology

## 2015-01-14 ENCOUNTER — Telehealth: Payer: Self-pay | Admitting: Hematology and Oncology

## 2015-01-14 ENCOUNTER — Telehealth: Payer: Self-pay | Admitting: *Deleted

## 2015-01-14 ENCOUNTER — Encounter: Payer: Self-pay | Admitting: Hematology and Oncology

## 2015-01-14 ENCOUNTER — Ambulatory Visit (HOSPITAL_BASED_OUTPATIENT_CLINIC_OR_DEPARTMENT_OTHER): Payer: BC Managed Care – PPO | Admitting: Hematology and Oncology

## 2015-01-14 VITALS — BP 134/94 | HR 81 | Temp 98.3°F | Resp 18 | Ht 66.0 in | Wt 289.8 lb

## 2015-01-14 DIAGNOSIS — D509 Iron deficiency anemia, unspecified: Secondary | ICD-10-CM

## 2015-01-14 DIAGNOSIS — N921 Excessive and frequent menstruation with irregular cycle: Secondary | ICD-10-CM

## 2015-01-14 MED ORDER — DIPHENHYDRAMINE HCL 25 MG PO TABS
25.0000 mg | ORAL_TABLET | Freq: Once | ORAL | Status: AC
  Administered 2015-01-14: 25 mg via ORAL
  Filled 2015-01-14: qty 1

## 2015-01-14 MED ORDER — SODIUM CHLORIDE 0.9 % IV SOLN
Freq: Once | INTRAVENOUS | Status: AC
  Administered 2015-01-14: 09:00:00 via INTRAVENOUS

## 2015-01-14 MED ORDER — METHYLPREDNISOLONE SODIUM SUCC 40 MG IJ SOLR
INTRAMUSCULAR | Status: AC
  Filled 2015-01-14: qty 1

## 2015-01-14 MED ORDER — METHYLPREDNISOLONE SODIUM SUCC 40 MG IJ SOLR
40.0000 mg | Freq: Once | INTRAMUSCULAR | Status: AC
  Administered 2015-01-14: 40 mg via INTRAVENOUS

## 2015-01-14 MED ORDER — DIPHENHYDRAMINE HCL 25 MG PO CAPS
ORAL_CAPSULE | ORAL | Status: AC
  Filled 2015-01-14: qty 1

## 2015-01-14 MED ORDER — SODIUM CHLORIDE 0.9 % IV SOLN
200.0000 mg | Freq: Once | INTRAVENOUS | Status: AC
  Administered 2015-01-14: 200 mg via INTRAVENOUS
  Filled 2015-01-14: qty 10

## 2015-01-14 NOTE — Assessment & Plan Note (Addendum)
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 We discussed some of the risks, benefits, and alternatives of intravenous iron infusions. The patient is symptomatic from anemia and the iron level is critically low. She tolerated oral iron supplement poorly and desires to achieved higher levels of iron faster for adequate hematopoesis. Some of the side-effects to be expected including risks of infusion reactions, phlebitis, headaches, nausea and fatigue.  The patient is willing to proceed. Patient education material was dispensed.  Goal is to keep ferritin level greater than 50. She suspect that her blood count may drop again in the near future and wanted to proceed with iron infusion today even though she is technically not anemic. Due to prior infusion reaction, I will change the iron to iron sucrose and to premedicate her with Solu-Medrol and Benadryl prior to treatment. After the iron infusion, I recommend she takes oral iron supplement daily. Plan to see her back in 6 months we will repeat blood work.

## 2015-01-14 NOTE — Assessment & Plan Note (Signed)
She is undergoing evaluation and treatment for menorrhagia.

## 2015-01-14 NOTE — Telephone Encounter (Signed)
Gave avs & calendar for August. Sent message to schedule treatment

## 2015-01-14 NOTE — Patient Instructions (Signed)
Iron Sucrose injection  What is this medicine?  IRON SUCROSE (AHY ern SOO krohs) is an iron complex. Iron is used to make healthy red blood cells, which carry oxygen and nutrients throughout the body. This medicine is used to treat iron deficiency anemia in people with chronic kidney disease.  This medicine may be used for other purposes; ask your health care provider or pharmacist if you have questions.  COMMON BRAND NAME(S): Venofer  What should I tell my health care provider before I take this medicine?  They need to know if you have any of these conditions:  -anemia not caused by low iron levels  -heart disease  -high levels of iron in the blood  -kidney disease  -liver disease  -an unusual or allergic reaction to iron, other medicines, foods, dyes, or preservatives  -pregnant or trying to get pregnant  -breast-feeding  How should I use this medicine?  This medicine is for infusion into a vein. It is given by a health care professional in a hospital or clinic setting.  Talk to your pediatrician regarding the use of this medicine in children. While this drug may be prescribed for children as young as 2 years for selected conditions, precautions do apply.  Overdosage: If you think you have taken too much of this medicine contact a poison control center or emergency room at once.  NOTE: This medicine is only for you. Do not share this medicine with others.  What if I miss a dose?  It is important not to miss your dose. Call your doctor or health care professional if you are unable to keep an appointment.  What may interact with this medicine?  Do not take this medicine with any of the following medications:  -deferoxamine  -dimercaprol  -other iron products  This medicine may also interact with the following medications:  -chloramphenicol  -deferasirox  This list may not describe all possible interactions. Give your health care provider a list of all the medicines, herbs, non-prescription drugs, or dietary  supplements you use. Also tell them if you smoke, drink alcohol, or use illegal drugs. Some items may interact with your medicine.  What should I watch for while using this medicine?  Visit your doctor or healthcare professional regularly. Tell your doctor or healthcare professional if your symptoms do not start to get better or if they get worse. You may need blood work done while you are taking this medicine.  You may need to follow a special diet. Talk to your doctor. Foods that contain iron include: whole grains/cereals, dried fruits, beans, or peas, leafy green vegetables, and organ meats (liver, kidney).  What side effects may I notice from receiving this medicine?  Side effects that you should report to your doctor or health care professional as soon as possible:  -allergic reactions like skin rash, itching or hives, swelling of the face, lips, or tongue  -breathing problems  -changes in blood pressure  -cough  -fast, irregular heartbeat  -feeling faint or lightheaded, falls  -fever or chills  -flushing, sweating, or hot feelings  -joint or muscle aches/pains  -seizures  -swelling of the ankles or feet  -unusually weak or tired  Side effects that usually do not require medical attention (report to your doctor or health care professional if they continue or are bothersome):  -diarrhea  -feeling achy  -headache  -irritation at site where injected  -nausea, vomiting  -stomach upset  -tiredness  This list may not describe all possible   side effects. Call your doctor for medical advice about side effects. You may report side effects to FDA at 1-800-FDA-1088.  Where should I keep my medicine?  This drug is given in a hospital or clinic and will not be stored at home.  NOTE: This sheet is a summary. It may not cover all possible information. If you have questions about this medicine, talk to your doctor, pharmacist, or health care provider.   2015, Elsevier/Gold Standard. (2011-08-20 17:14:35)

## 2015-01-14 NOTE — Progress Notes (Signed)
[name] OFFICE PROGRESS NOTE  Heidi Baseman, MD SUMMARY OF HEMATOLOGIC HISTORY:  The patient had back or history of menorrhagia for many years and had received iron infusion in 2009. She had heavy menstruation at one point, lasting 20 days. Recently, she is undergoing fertility treatment and had no periods for 2 months.  She was found to have abnormal CBC from recent blood work. Her CBC from 06/21/2013 was low at 11.2. On 09/17/2014, her hemoglobin has dropped and 9 with MCV of 72.7. Iron studies confirmed iron deficiency anemia. She denies recent chest pain on exertion, but they complain ofshortness of breath on minimal exertion, pre-syncopal episodes, palpitations and leg cramps. She had not noticed any recent bleeding such as epistaxis, hematuria or hematochezia The patient denies over the counter NSAID ingestion. She is not on antiplatelets agents.  She had no prior history or diagnosis of cancer. Her age appropriate screening programs are up-to-date. She has significant pica with chewing of ice. She eats a variety of diet. She never donated blood or received blood transfusion The patient was prescribed oral iron supplements and she takes 2-3 times a day with meals for the past 1 week. On 10/19/2014, she received 1 dose of iron Feraheme. In January 2016, she had allergic reaction to IV iron. That was managed successfully with additional premedications. INTERVAL HISTORY: Heidi Baker 39 y.o. female returns for further follow-up. She complained of fatigue. The patient denies any recent signs or symptoms of bleeding such as spontaneous epistaxis, hematuria or hematochezia.   I have reviewed the past medical history, past surgical history, social history and family history with the patient and they are unchanged from previous note.  ALLERGIES:  is allergic to amoxicillin and ferumoxytol.  MEDICATIONS:  Current Outpatient Prescriptions  Medication Sig Dispense  Refill  . progesterone (PROMETRIUM) 200 MG capsule Take 400 mg by mouth.    . QUEtiapine (SEROQUEL XR) 300 MG 24 hr tablet Take 600 mg by mouth at bedtime.     No current facility-administered medications for this visit.     REVIEW OF SYSTEMS:   Constitutional: Denies fevers, chills or night sweats Eyes: Denies blurriness of vision Ears, nose, mouth, throat, and face: Denies mucositis or sore throat Respiratory: Denies cough, dyspnea or wheezes Cardiovascular: Denies palpitation, chest discomfort or lower extremity swelling Gastrointestinal:  Denies nausea, heartburn or change in bowel habits Skin: Denies abnormal skin rashes Lymphatics: Denies new lymphadenopathy or easy bruising Neurological:Denies numbness, tingling or new weaknesses Behavioral/Psych: Mood is stable, no new changes  All other systems were reviewed with the patient and are negative.  PHYSICAL EXAMINATION: ECOG PERFORMANCE STATUS: 1 - Symptomatic but completely ambulatory  Filed Vitals:   01/14/15 0852  BP: 134/94  Pulse: 81  Temp: 98.3 F (36.8 C)  Resp: 18   Filed Weights   01/14/15 0852  Weight: 289 lb 12.8 oz (131.452 kg)    GENERAL:alert, no distress and comfortable. She is morbidly obese SKIN: skin color, texture, turgor are normal, no rashes or significant lesions EYES: normal, Conjunctiva are pink and non-injected, sclera clear NEURO: alert & oriented x 3 with fluent speech, no focal motor/sensory deficits  LABORATORY DATA:  I have reviewed the data as listed No results found for this or any previous visit (from the past 48 hour(s)).  Lab Results  Component Value Date   WBC 4.1 01/07/2015   HGB 13.9 01/07/2015   HCT 42.9 01/07/2015   MCV 87.2 01/07/2015   PLT 180 01/07/2015  ASSESSMENT & PLAN:  Iron deficiency anemia The most likely cause of her anemia is due to chronic blood loss from menorrhagia. We discussed some of the risks, benefits, and alternatives of intravenous iron  infusions. The patient is symptomatic from anemia and the iron level is critically low. She tolerated oral iron supplement poorly and desires to achieved higher levels of iron faster for adequate hematopoesis. Some of the side-effects to be expected including risks of infusion reactions, phlebitis, headaches, nausea and fatigue.  The patient is willing to proceed. Patient education material was dispensed.  Goal is to keep ferritin level greater than 50. She suspect that her blood count may drop again in the near future and wanted to proceed with iron infusion today even though she is technically not anemic. Due to prior infusion reaction, I will change the iron to iron sucrose and to premedicate her with Solu-Medrol and Benadryl prior to treatment. After the iron infusion, I recommend she takes oral iron supplement daily. Plan to see her back in 6 months we will repeat blood work.    Menorrhagia with irregular cycle She is undergoing evaluation and treatment for menorrhagia.    All questions were answered. The patient knows to call the clinic with any problems, questions or concerns. No barriers to learning was detected.  I spent 15 minutes counseling the patient face to face. The total time spent in the appointment was 20 minutes and more than 50% was on counseling.     [facility]OR[facility]CH, Arlynn Stare, MD 2/22/20169:23 AM

## 2015-01-14 NOTE — Telephone Encounter (Signed)
Per staff message and POF I have scheduled appts. Advised scheduler of appts. JMW  

## 2015-07-15 ENCOUNTER — Other Ambulatory Visit (HOSPITAL_BASED_OUTPATIENT_CLINIC_OR_DEPARTMENT_OTHER): Payer: BC Managed Care – PPO

## 2015-07-15 DIAGNOSIS — D509 Iron deficiency anemia, unspecified: Secondary | ICD-10-CM | POA: Diagnosis not present

## 2015-07-15 LAB — IRON AND TIBC CHCC
%SAT: 28 % (ref 21–57)
IRON: 81 ug/dL (ref 41–142)
TIBC: 288 ug/dL (ref 236–444)
UIBC: 206 ug/dL (ref 120–384)

## 2015-07-15 LAB — CBC & DIFF AND RETIC
BASO%: 1.4 % (ref 0.0–2.0)
Basophils Absolute: 0.1 10*3/uL (ref 0.0–0.1)
EOS ABS: 0.4 10*3/uL (ref 0.0–0.5)
EOS%: 7.7 % — ABNORMAL HIGH (ref 0.0–7.0)
HCT: 42.9 % (ref 34.8–46.6)
HEMOGLOBIN: 14.5 g/dL (ref 11.6–15.9)
Immature Retic Fract: 5.3 % (ref 1.60–10.00)
LYMPH%: 35.4 % (ref 14.0–49.7)
MCH: 30.6 pg (ref 25.1–34.0)
MCHC: 33.8 g/dL (ref 31.5–36.0)
MCV: 90.5 fL (ref 79.5–101.0)
MONO#: 0.8 10*3/uL (ref 0.1–0.9)
MONO%: 15.2 % — AB (ref 0.0–14.0)
NEUT%: 40.3 % (ref 38.4–76.8)
NEUTROS ABS: 2 10*3/uL (ref 1.5–6.5)
PLATELETS: 176 10*3/uL (ref 145–400)
RBC: 4.74 10*6/uL (ref 3.70–5.45)
RDW: 13.7 % (ref 11.2–14.5)
Retic %: 1.42 % (ref 0.70–2.10)
Retic Ct Abs: 67.31 10*3/uL (ref 33.70–90.70)
WBC: 4.9 10*3/uL (ref 3.9–10.3)
lymph#: 1.8 10*3/uL (ref 0.9–3.3)

## 2015-07-15 LAB — FERRITIN CHCC: Ferritin: 39 ng/ml (ref 9–269)

## 2015-07-22 ENCOUNTER — Encounter: Payer: Self-pay | Admitting: Hematology and Oncology

## 2015-07-22 ENCOUNTER — Ambulatory Visit: Payer: BC Managed Care – PPO

## 2015-07-22 ENCOUNTER — Ambulatory Visit: Payer: BC Managed Care – PPO | Admitting: Hematology and Oncology

## 2015-07-22 ENCOUNTER — Encounter: Payer: Self-pay | Admitting: *Deleted

## 2015-07-22 NOTE — Progress Notes (Signed)
Missed appointment letter from Dr. Gorsuch placed in outgoing mail to pt's home address.  

## 2015-09-10 ENCOUNTER — Encounter (HOSPITAL_COMMUNITY): Payer: Self-pay | Admitting: Emergency Medicine

## 2015-09-10 ENCOUNTER — Emergency Department (HOSPITAL_COMMUNITY): Payer: BC Managed Care – PPO

## 2015-09-10 ENCOUNTER — Emergency Department (HOSPITAL_COMMUNITY)
Admission: EM | Admit: 2015-09-10 | Discharge: 2015-09-10 | Disposition: A | Discharge status: Home / Self Care | Payer: BC Managed Care – PPO | Attending: Emergency Medicine | Admitting: Emergency Medicine

## 2015-09-10 DIAGNOSIS — R51 Headache: Secondary | ICD-10-CM | POA: Insufficient documentation

## 2015-09-10 DIAGNOSIS — Z880 Allergy status to penicillin: Secondary | ICD-10-CM | POA: Insufficient documentation

## 2015-09-10 DIAGNOSIS — Z8659 Personal history of other mental and behavioral disorders: Secondary | ICD-10-CM | POA: Diagnosis not present

## 2015-09-10 DIAGNOSIS — Z87891 Personal history of nicotine dependence: Secondary | ICD-10-CM | POA: Insufficient documentation

## 2015-09-10 DIAGNOSIS — R079 Chest pain, unspecified: Secondary | ICD-10-CM | POA: Diagnosis not present

## 2015-09-10 DIAGNOSIS — I10 Essential (primary) hypertension: Secondary | ICD-10-CM | POA: Diagnosis not present

## 2015-09-10 DIAGNOSIS — Z3202 Encounter for pregnancy test, result negative: Secondary | ICD-10-CM | POA: Insufficient documentation

## 2015-09-10 DIAGNOSIS — R519 Headache, unspecified: Secondary | ICD-10-CM

## 2015-09-10 HISTORY — DX: Essential (primary) hypertension: I10

## 2015-09-10 LAB — I-STAT TROPONIN, ED
TROPONIN I, POC: 0 ng/mL (ref 0.00–0.08)
TROPONIN I, POC: 0 ng/mL (ref 0.00–0.08)

## 2015-09-10 LAB — CBC
HCT: 44.2 % (ref 36.0–46.0)
Hemoglobin: 14.8 g/dL (ref 12.0–15.0)
MCH: 30.4 pg (ref 26.0–34.0)
MCHC: 33.5 g/dL (ref 30.0–36.0)
MCV: 90.8 fL (ref 78.0–100.0)
PLATELETS: 208 10*3/uL (ref 150–400)
RBC: 4.87 MIL/uL (ref 3.87–5.11)
RDW: 13.7 % (ref 11.5–15.5)
WBC: 4.9 10*3/uL (ref 4.0–10.5)

## 2015-09-10 LAB — BASIC METABOLIC PANEL
Anion gap: 7 (ref 5–15)
BUN: 10 mg/dL (ref 6–20)
CALCIUM: 9.5 mg/dL (ref 8.9–10.3)
CO2: 23 mmol/L (ref 22–32)
CREATININE: 0.7 mg/dL (ref 0.44–1.00)
Chloride: 108 mmol/L (ref 101–111)
Glucose, Bld: 82 mg/dL (ref 65–99)
Potassium: 3.9 mmol/L (ref 3.5–5.1)
SODIUM: 138 mmol/L (ref 135–145)

## 2015-09-10 LAB — POC URINE PREG, ED: PREG TEST UR: NEGATIVE

## 2015-09-10 MED ORDER — IOHEXOL 350 MG/ML SOLN
100.0000 mL | Freq: Once | INTRAVENOUS | Status: AC | PRN
  Administered 2015-09-10: 100 mL via INTRAVENOUS

## 2015-09-10 NOTE — ED Notes (Signed)
Per pt, states chest pain on and off since yesterday-states driving to work and started having upper central chest pain

## 2015-09-10 NOTE — ED Provider Notes (Signed)
CSN: [id number]     Arrival date & time 09/10/15  [id number] History   First MD Initiated Contact with Patient 09/10/15 [phone]     Chief Complaint  Patient presents with  . Chest Pain     (Consider location/radiation/quality/duration/timing/severity/associated sxs/prior Treatment) HPI Comments: 39 year old female with a history of hypertension, anxiety, and depression who presents with chest pain. The patient states that yesterday around 12:00, she began having a headache that quickly escalated to severe headache over 10-15 minutes. She had associated blurry vision and lightheadedness. She also had some associated mild neck pain. She had one episode of emesis last night. Her headache has improved today but this morning while she was driving to work she began having central chest pain which she describes as sharp as well as an intermittent tightness. She states that her head has felt "foggy" today. She denies any significant headache today. No fevers or recent illness. She endorses some mild shortness of breath with the chest pain. She denies any recent travel, leg swelling, OCP use, history of cancer, or history of blood clots. Family history negative for blood clots. Family history notable for father with MI in his 8440s and mother with hypertension.  Patient is a 39 y.o. female presenting with chest pain. The history is provided by the patient.  Chest Pain   Past Medical History  Diagnosis Date  . Anxiety   . Depression   . Hypertension    Past Surgical History  Procedure Laterality Date  . Uterine fibroid surgery  2009   No family history on file. Social History  Substance Use Topics  . Smoking status: Former Smoker -- 0.50 packs/day for 8 years    Types: Cigarettes    Quit date: 11/06/2012  . Smokeless tobacco: Never Used  . Alcohol Use: 0.6 oz/week    1 Glasses of wine per week     Comment: one alcoholic drink monthly, wine or mixed drink   OB History    No data available      Review of Systems  Cardiovascular: Positive for chest pain.      Allergies  Amoxicillin and Ferumoxytol  Home Medications   Prior to Admission medications   Medication Sig Start Date End Date Taking? Authorizing Provider  Melatonin 10 MG TABS Take 10 mg by mouth at bedtime.   Yes Historical Provider, MD  naproxen (NAPROSYN) 500 MG tablet Take 500 mg by mouth daily as needed for mild pain.   Yes Historical Provider, MD   BP 138/95 mmHg  Pulse 73  Temp(Src) 98.1 F (36.7 C) (Oral)  Resp 16  SpO2 98%  LMP 08/13/2015 (Within Days) Physical Exam  Constitutional: She is oriented to person, place, and time. She appears well-developed and well-nourished. No distress.  Awake, alert  HENT:  Head: Normocephalic and atraumatic.  Eyes: Conjunctivae and EOM are normal. Pupils are equal, round, and reactive to light.  Neck: Neck supple.  Cardiovascular: Normal rate, regular rhythm and normal heart sounds.   No murmur heard. Pulmonary/Chest: Effort normal and breath sounds normal. No respiratory distress.  Abdominal: Soft. Bowel sounds are normal. She exhibits no distension.  Musculoskeletal: She exhibits no edema.  Neurological: She is alert and oriented to person, place, and time. She has normal reflexes. No cranial nerve deficit. She exhibits normal muscle tone.  Fluent speech, normal finger-to-nose testing  Skin: Skin is warm and dry.  Psychiatric: She has a normal mood and affect. Judgment and thought content normal.  Nursing note and  vitals reviewed.   ED Course  Procedures (including critical care time) Labs Review Labs Reviewed  BASIC METABOLIC PANEL  CBC  I-STAT TROPOININ, ED  POC URINE PREG, ED  I-STAT TROPOININ, ED    Imaging Review Ct Angio Head W/cm &/or Wo Cm  09/10/2015  CLINICAL DATA:  Sudden onset headaches last night with blurry vision and neck pain EXAM: CT ANGIOGRAPHY HEAD AND NECK TECHNIQUE: Multidetector CT imaging of the head and neck was performed  using the standard protocol during bolus administration of intravenous contrast. Multiplanar CT image reconstructions and MIPs were obtained to evaluate the vascular anatomy. Carotid stenosis measurements (when applicable) are obtained utilizing NASCET criteria, using the distal internal carotid diameter as the denominator. CONTRAST:  OMNIPAQUE IOHEXOL 350 MG/ML SOLN COMPARISON:  None. FINDINGS: CT HEAD Skull and Sinuses:Negative for fracture or destructive process. Bilateral ethmoid opacification. No indication of acute sinusitis or mastoiditis. Orbits: No acute abnormality. Brain: Unremarkable. No acute or remote infarction, hemorrhage, hydrocephalus, or mass lesion/mass effect. CTA NECK Aortic arch: No visualized aneurysm or dissection. No inflammatory wall thickening. Four vessel branching. Right carotid system: Obscured proximal common carotid artery from streak artifact due to intravenous contrast. Mild tortuosity of the internal carotid artery. No atheromatous change, dissection, or beading. Left carotid system: Symmetric mild ICA tortuosity. No atheromatous change, dissection, or beading. Vertebral arteries:Mild right vertebral artery dominance. The left vertebral artery arises from the aortic arch. No stenosis or evidence of dissection. Skeleton: No contributory finding. Other neck: No incidental mass or adenopathy in the neck. CTA HEAD Limited by venous contamination Anterior circulation: Small anterior and right posterior communicating arteries are present.No major branch occlusion, stenosis, or evidence of aneurysm/vascular malformation. No beading to suggest vasculopathy. Posterior circulation: Mild right vertebral artery dominance. Symmetric vertebral and basilar branching. The proximal left PICA is difficult to visualize on axial imaging where looping upward, but appears continuous on reformats. No suspected flow limiting stenosis. No evidence of vasculopathy, aneurysm, or vascular malformation.  Venous sinuses:  Patent. Anatomic variants: None significant Delayed phase: No abnormal parenchymal enhancement IMPRESSION: Negative CTA of the head and neck. Electronically Signed   By: Marnee Spring M.D.   On: 09/10/2015 11:58   Dg Chest 2 View  09/10/2015  CLINICAL DATA:  Acute chest pain. EXAM: CHEST  2 VIEW COMPARISON:  None. FINDINGS: The heart size and mediastinal contours are within normal limits. Both lungs are clear. No pneumothorax or significant pleural effusion is noted. The visualized skeletal structures are unremarkable. IMPRESSION: No active cardiopulmonary disease. Electronically Signed   By: Lupita Raider, M.D.   On: 09/10/2015 09:30   Ct Angio Neck W/cm &/or Wo/cm  09/10/2015  CLINICAL DATA:  Sudden onset headaches last night with blurry vision and neck pain EXAM: CT ANGIOGRAPHY HEAD AND NECK TECHNIQUE: Multidetector CT imaging of the head and neck was performed using the standard protocol during bolus administration of intravenous contrast. Multiplanar CT image reconstructions and MIPs were obtained to evaluate the vascular anatomy. Carotid stenosis measurements (when applicable) are obtained utilizing NASCET criteria, using the distal internal carotid diameter as the denominator. CONTRAST:  OMNIPAQUE IOHEXOL 350 MG/ML SOLN COMPARISON:  None. FINDINGS: CT HEAD Skull and Sinuses:Negative for fracture or destructive process. Bilateral ethmoid opacification. No indication of acute sinusitis or mastoiditis. Orbits: No acute abnormality. Brain: Unremarkable. No acute or remote infarction, hemorrhage, hydrocephalus, or mass lesion/mass effect. CTA NECK Aortic arch: No visualized aneurysm or dissection. No inflammatory wall thickening. Four vessel branching. Right carotid  system: Obscured proximal common carotid artery from streak artifact due to intravenous contrast. Mild tortuosity of the internal carotid artery. No atheromatous change, dissection, or beading. Left carotid system:  Symmetric mild ICA tortuosity. No atheromatous change, dissection, or beading. Vertebral arteries:Mild right vertebral artery dominance. The left vertebral artery arises from the aortic arch. No stenosis or evidence of dissection. Skeleton: No contributory finding. Other neck: No incidental mass or adenopathy in the neck. CTA HEAD Limited by venous contamination Anterior circulation: Small anterior and right posterior communicating arteries are present.No major branch occlusion, stenosis, or evidence of aneurysm/vascular malformation. No beading to suggest vasculopathy. Posterior circulation: Mild right vertebral artery dominance. Symmetric vertebral and basilar branching. The proximal left PICA is difficult to visualize on axial imaging where looping upward, but appears continuous on reformats. No suspected flow limiting stenosis. No evidence of vasculopathy, aneurysm, or vascular malformation. Venous sinuses:  Patent. Anatomic variants: None significant Delayed phase: No abnormal parenchymal enhancement IMPRESSION: Negative CTA of the head and neck. Electronically Signed   By: Marnee Spring M.D.   On: 09/10/2015 11:58   I have personally reviewed and evaluated these lab results as part of my medical decision-making.   EKG Interpretation None     Medications  iohexol (OMNIPAQUE) 350 MG/ML injection 100 mL (100 mLs Intravenous Contrast Given 09/10/15 1058)    MDM   Final diagnoses:  Headache  Chest pain, unspecified chest pain type    39 year old female who presents with intermittent chest pain associated with shortness of breath that began while driving earlier today as well as an episode of severe headache that occurred yesterday. Patient was comfortable on exam. Vital signs notable for hypertension at 163/117. No neurologic deficits on exam. EKG on arrival showed no acute ischemic changes. Chest x-ray was unremarkable. Obtained basic lab work including serial troponins which were normal. Pt  is PERC negative therefore PE is very unlikely. Regarding headache, the patient states that headache was relatively sudden in onset and she denies any significant history of headaches. Differential diagnosis includes SAH. Obtained a CTA of the head and neck given concerning history. CTA was unremarkable. I discussed results with the patient. Given that she is symptom-free today, my suspicion for significant bleed is very low and I do not feel she needs any further w/u including LP here. Her HEART score is </=3 and I feel she is safe for outpatient f/u of her symptoms. Emphasized importance of PCP follow-up regarding her hypertension as well as her chest pain symptoms. Return precautions reviewed and patient voiced understanding. Patient was discharged in satisfactory condition.   Laurence Spates, MD 09/10/15 [phone]

## 2015-09-10 NOTE — ED Notes (Signed)
Went to collect blood samples,rn was starting an IV and was going to collect samples.

## 2015-09-10 NOTE — ED Notes (Signed)
PATIENT WANTS BLOOD COLLECT FROM IV.

## 2015-10-11 ENCOUNTER — Other Ambulatory Visit: Payer: Self-pay | Admitting: Hematology and Oncology

## 2016-02-18 ENCOUNTER — Emergency Department (HOSPITAL_COMMUNITY)
Admission: EM | Admit: 2016-02-18 | Discharge: 2016-02-18 | Disposition: A | Discharge status: Home / Self Care | Payer: BC Managed Care – PPO | Attending: Emergency Medicine | Admitting: Emergency Medicine

## 2016-02-18 ENCOUNTER — Inpatient Hospital Stay (HOSPITAL_COMMUNITY): Admission: AD | Admit: 2016-02-18 | Payer: BC Managed Care – PPO | Source: Intra-hospital | Admitting: Psychiatry

## 2016-02-18 ENCOUNTER — Encounter (HOSPITAL_COMMUNITY): Payer: Self-pay

## 2016-02-18 DIAGNOSIS — I10 Essential (primary) hypertension: Secondary | ICD-10-CM | POA: Diagnosis not present

## 2016-02-18 DIAGNOSIS — F419 Anxiety disorder, unspecified: Secondary | ICD-10-CM | POA: Insufficient documentation

## 2016-02-18 DIAGNOSIS — Z79899 Other long term (current) drug therapy: Secondary | ICD-10-CM | POA: Insufficient documentation

## 2016-02-18 DIAGNOSIS — F3130 Bipolar disorder, current episode depressed, mild or moderate severity, unspecified: Secondary | ICD-10-CM | POA: Insufficient documentation

## 2016-02-18 DIAGNOSIS — F1310 Sedative, hypnotic or anxiolytic abuse, uncomplicated: Secondary | ICD-10-CM | POA: Diagnosis not present

## 2016-02-18 DIAGNOSIS — Z87891 Personal history of nicotine dependence: Secondary | ICD-10-CM | POA: Diagnosis not present

## 2016-02-18 DIAGNOSIS — R4182 Altered mental status, unspecified: Secondary | ICD-10-CM | POA: Diagnosis present

## 2016-02-18 DIAGNOSIS — F39 Unspecified mood [affective] disorder: Secondary | ICD-10-CM

## 2016-02-18 DIAGNOSIS — Z3202 Encounter for pregnancy test, result negative: Secondary | ICD-10-CM | POA: Diagnosis not present

## 2016-02-18 DIAGNOSIS — F319 Bipolar disorder, unspecified: Secondary | ICD-10-CM

## 2016-02-18 LAB — PREGNANCY, URINE: Preg Test, Ur: NEGATIVE

## 2016-02-18 LAB — SALICYLATE LEVEL: Salicylate Lvl: <4 mg/dL (ref 2.8–30.0)

## 2016-02-18 LAB — CBC
HEMATOCRIT: 42.4 % (ref 36.0–46.0)
HEMOGLOBIN: 14.5 g/dL (ref 12.0–15.0)
MCH: 29.9 pg (ref 26.0–34.0)
MCHC: 34.2 g/dL (ref 30.0–36.0)
MCV: 87.4 fL (ref 78.0–100.0)
Platelets: 175 10*3/uL (ref 150–400)
RBC: 4.85 MIL/uL (ref 3.87–5.11)
RDW: 13.4 % (ref 11.5–15.5)
WBC: 4.7 10*3/uL (ref 4.0–10.5)

## 2016-02-18 LAB — COMPREHENSIVE METABOLIC PANEL
ALT: 14 U/L (ref 14–54)
AST: 26 U/L (ref 15–41)
Albumin: 4.8 g/dL (ref 3.5–5.0)
Alkaline Phosphatase: 52 U/L (ref 38–126)
Anion gap: 13 (ref 5–15)
BILIRUBIN TOTAL: 0.8 mg/dL (ref 0.3–1.2)
BUN: 10 mg/dL (ref 6–20)
CHLORIDE: 108 mmol/L (ref 101–111)
CO2: 18 mmol/L — ABNORMAL LOW (ref 22–32)
CREATININE: 0.76 mg/dL (ref 0.44–1.00)
Calcium: 10 mg/dL (ref 8.9–10.3)
Glucose, Bld: 96 mg/dL (ref 65–99)
POTASSIUM: 3.2 mmol/L — AB (ref 3.5–5.1)
Sodium: 139 mmol/L (ref 135–145)
TOTAL PROTEIN: 7.8 g/dL (ref 6.5–8.1)

## 2016-02-18 LAB — RAPID URINE DRUG SCREEN, HOSP PERFORMED
Amphetamines: NOT DETECTED
BENZODIAZEPINES: NOT DETECTED
Barbiturates: NOT DETECTED
COCAINE: NOT DETECTED
OPIATES: NOT DETECTED
Tetrahydrocannabinol: POSITIVE — AB

## 2016-02-18 LAB — URINALYSIS, ROUTINE W REFLEX MICROSCOPIC
Bilirubin Urine: NEGATIVE
GLUCOSE, UA: NEGATIVE mg/dL
HGB URINE DIPSTICK: NEGATIVE
Ketones, ur: >80 mg/dL — AB
LEUKOCYTES UA: NEGATIVE
Nitrite: NEGATIVE
Protein, ur: NEGATIVE mg/dL
SPECIFIC GRAVITY, URINE: 1.016 (ref 1.005–1.030)
pH: 8 (ref 5.0–8.0)

## 2016-02-18 LAB — ETHANOL

## 2016-02-18 LAB — ACETAMINOPHEN LEVEL: Acetaminophen (Tylenol), Serum: <10 ug/mL — ABNORMAL LOW (ref 10–30)

## 2016-02-18 MED ORDER — SODIUM CHLORIDE 0.9 % IV BOLUS (SEPSIS)
1000.0000 mL | Freq: Once | INTRAVENOUS | Status: AC
  Administered 2016-02-18: 1000 mL via INTRAVENOUS

## 2016-02-18 MED ORDER — POTASSIUM CHLORIDE CRYS ER 20 MEQ PO TBCR
40.0000 meq | EXTENDED_RELEASE_TABLET | Freq: Once | ORAL | Status: AC
  Administered 2016-02-18: 40 meq via ORAL
  Filled 2016-02-18: qty 2
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
## 2016-02-18 NOTE — Progress Notes (Signed)
ED CM consulted by ED RN, about concern with pt behavioral with female visitor visiting her Staring at ED RN with "glassy eyes" as if fearful ED SW notified

## 2016-02-18 NOTE — ED Notes (Signed)
Per spouse, pt has hx of bipolar and PTSD.  MD took her off of her meds for at least a year or more.  Pt is under a lot of stress with school and work.  Pt is alert and oriented but slow to respond. Spouse says this happens at time. Pt denies any pain or symptoms.  No neuro deficit noted.

## 2016-02-18 NOTE — ED Provider Notes (Signed)
CSN: [id number]     Arrival date & time 02/18/16  1235 History   First MD Initiated Contact with Patient 02/18/16 1458     Chief Complaint  Patient presents with  . Altered Mental Status   HPI  Ms. Dareen Pianonderson is a 40 year old female with PMHx of HTN, depression, anxiety, bipolar and PTSD presenting with AMS. Patient's husband is at bedside and provides most of the history. He reports a history of bipolar disorder that she has not had a manic episode in over a year. He has noted that the patient has been struggling with insomnia for the past 2-3 days. He states that she has been wandering around the house in the days. 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 Her psychiatrist took her off Seroquel over a year ago put back on them. Today at school, witnesses state that the patient was wandering the halls "in a daze". When EMS arrived, she could not remember her name or had given EMS a different name. Apparently told EMS she had smoked marijuana before school but now denies this. Patient is extremely withdrawn and answers questions with brief responses. She denise amnesia of the event this morning. She states that she was trying to ask for help. She endorses insomnia and racing thoughts over the past 2-3 days. She states that she is overwhelmed and feeling sad. She becomes tearful during questioning about this. She also notes she has had a decreased appetite and has not been drinking and or eating as much. She states she has been stressed out recently and needs her Seroquel. She has not followed with a behavioral counselor or psychiatrist in over a year. She denies current SI but does endorse a history of self harming behaviors. Denies AVH or delusions. She has no physical complaints.   Past Medical History  Diagnosis Date  . Anxiety   . Depression   . Hypertension    Past Surgical  History  Procedure Laterality Date  . Uterine fibroid surgery  2009   History reviewed. No pertinent family history. Social History  Substance Use Topics  . Smoking status: Former Smoker -- 0.50 packs/day for 8 years    Types: Cigarettes    Quit date: 11/06/2012  . Smokeless tobacco: Never Used  . Alcohol Use: 0.6 oz/week    1 Glasses of wine per week     Comment: one alcoholic drink monthly, wine or mixed drink   OB History    No data available     Review of Systems  All other systems reviewed and are negative.     Allergies  Amoxicillin and Ferumoxytol  Home Medications   Prior to Admission medications   Medication Sig Start Date End Date Taking? Authorizing Provider  diphenhydramine-acetaminophen (TYLENOL PM) 25-500 MG TABS tablet Take 1-2 tablets by mouth at bedtime as needed (for sleep).   Yes Historical Provider, MD  MELATONIN PO Take 1 tablet by mouth daily.   Yes Historical Provider, MD  VALERIAN PO Take 3 tablets by mouth daily.   Yes Historical Provider, MD   BP 171/105 mmHg  Pulse 73  Temp(Src) 97.9 F (36.6 C) (Oral)  Resp 20  SpO2 100%  LMP  (LMP Unknown) Physical Exam  Constitutional: She is oriented to person, place, and time. She appears well-developed and well-nourished. No distress.  HENT:  Head: Normocephalic and atraumatic.  Eyes: Conjunctivae are  normal. Right eye exhibits no discharge. Left eye exhibits no discharge. No scleral icterus.  Neck: Normal range of motion.  Cardiovascular: Normal rate and regular rhythm.   Pulmonary/Chest: Effort normal. No respiratory distress.  Musculoskeletal: Normal range of motion.  Neurological: She is alert and oriented to person, place, and time. She has normal strength. No cranial nerve deficit or sensory deficit. Coordination normal. GCS eye subscore is 4. GCS verbal subscore is 5. GCS motor subscore is 6.  Skin: Skin is warm and dry.  Psychiatric: Thought content normal. Her speech is delayed. She is  slowed and withdrawn. She exhibits a depressed mood.  Pt extremely withdrawn and speaks with a quiet voice. Answers are short. Becomes tearful at times. Poor eye contact. Some psychomotor agitation at times. Becomes fixated on picking at her bracelet or the blanket.   Nursing note and vitals reviewed.   ED Course  Procedures (including critical care time) Labs Review Labs Reviewed  COMPREHENSIVE METABOLIC PANEL - Abnormal; Notable for the following:    Potassium 3.2 (*)    CO2 18 (*)    All other components within normal limits  URINE RAPID DRUG SCREEN, HOSP PERFORMED - Abnormal; Notable for the following:    Tetrahydrocannabinol POSITIVE (*)    All other components within normal limits  URINALYSIS, ROUTINE W REFLEX MICROSCOPIC (NOT AT [facility]) - Abnormal; Notable for the following:    Ketones, ur >80 (*)    All other components within normal limits  ACETAMINOPHEN LEVEL - Abnormal; Notable for the following:    Acetaminophen (Tylenol), Serum <10 (*)    All other components within normal limits  ETHANOL  CBC  SALICYLATE LEVEL  PREGNANCY, URINE    Imaging Review No results found. I have personally reviewed and evaluated these images and lab results as part of my medical decision-making.   EKG Interpretation None      MDM   Final diagnoses:  Bipolar depression (HCC)   40 year old female presenting with change in behavior over the past 2-3 days. History of bipolar depression. Patient reports having difficulty sleeping, racing thoughts, feelings of sadness and feeling overwhelmed. She has been off of her Seroquel for approximately one year. 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 Patient is hypertensive which she states is her baseline. She states that she does not wish to take blood pressure medications. Her exam. Patient is quiet and withdrawn. She becomes tearful at times. Potassium 3.2 which was repleted in the emergency  department. Ketones noted in the urine so patient was given fluid bolus for possible dehydration. She has not eating and drinking over the past 2-3 days. Drug screen positive for marijuana. Patient evaluated by a TTS who recommends obs. She states she does not wish to stay and will follow up with her primary care provider tomorrow. Patient does not meet criteria for involuntary commitment and is stable for discharge. Discussed with counselor who agrees that patient is stable for outpatient follow-up. Patient discharged in stable condition.    Nyesha Cliff, PA-C 02/19/16 0100  Benjiman Core, MD 02/20/16 [phone]

## 2016-02-18 NOTE — Discharge Instructions (Signed)
Schedule a follow up appointment with your PCP tomorrow to discuss medication management. Return to ED with new, worsening or concerning symptoms.    Bipolar Disorder Bipolar disorder is a mental illness. The term bipolar disorder actually is used to describe a group of disorders that all share varying degrees of emotional highs and lows that can interfere with daily functioning, such as work, school, or relationships. Bipolar disorder also can lead to drug abuse, hospitalization, and suicide. The emotional highs of bipolar disorder are periods of elation or irritability and high energy. These highs can range from a mild form (hypomania) to a severe form (mania). People experiencing episodes of hypomania may appear energetic, excitable, and highly productive. People experiencing mania may behave impulsively or erratically. They often make poor decisions. They may have difficulty sleeping. The most severe episodes of mania can involve having very distorted beliefs or perceptions about the world and seeing or hearing things that are not real (psychotic delusions and hallucinations).  The emotional lows of bipolar disorder (depression) also can range from mild to severe. Severe episodes of bipolar depression can involve psychotic delusions and hallucinations. Sometimes people with bipolar disorder experience a state of mixed mood. Symptoms of hypomania or mania and depression are both present during this mixed-mood episode. SIGNS AND SYMPTOMS There are signs and symptoms of the episodes of hypomania and mania as well as the episodes of depression. The signs and symptoms of hypomania and mania are similar but vary in severity. They include:  Inflated self-esteem or feeling of increased self-confidence.  Decreased need for sleep.  Unusual talkativeness (rapid or pressured speech) or the feeling of a need to keep talking.  Sensation of racing thoughts or constant talking, with quick shifts between topics  that may or may not be related (flight of ideas).  Decreased ability to focus or concentrate.  Increased purposeful activity, such as work, studies, or social activity, or nonproductive activity, such as pacing, squirming and fidgeting, or finger and toe tapping.  Impulsive behavior and use of poor judgment, resulting in high-risk activities, such as having unprotected sex or spending excessive amounts of money. Signs and symptoms of depression include the following:   Feelings of sadness, hopelessness, or helplessness.  Frequent or uncontrollable episodes of crying.  Lack of feeling anything or caring about anything.  Difficulty sleeping or sleeping too much.  Inability to enjoy the things you used to enjoy.   Desire to be alone all the time.   Feelings of guilt or worthlessness.  Lack of energy or motivation.   Difficulty concentrating, remembering, or making decisions.  Change in appetite or weight beyond normal fluctuations.  Thoughts of death or the desire to harm yourself. DIAGNOSIS  Bipolar disorder is diagnosed through an assessment by your caregiver. Your caregiver will ask questions about your emotional episodes. There are two main types of bipolar disorder. People with type I bipolar disorder have manic episodes with or without depressive episodes. People with type II bipolar disorder have hypomanic episodes and major depressive episodes, which are more serious than mild depression. The type of bipolar disorder you have can make an important difference in how your illness is monitored and treated. Your caregiver may ask questions about your medical history and use of alcohol or drugs, including prescription medication. Certain medical conditions and substances also can cause emotional highs and lows that resemble bipolar disorder (secondary bipolar disorder).  TREATMENT  Bipolar disorder is a long-term illness. It is best controlled with continuous treatment rather  than treatment only when symptoms occur. The following treatments can be prescribed for bipolar disorders:  Medication--Medication can be prescribed by a doctor that is an expert in treating mental disorders (psychiatrists). Medications called mood stabilizers are usually prescribed to help control the illness. Other medications are sometimes added if symptoms of mania, depression, or psychotic delusions and hallucinations occur despite the use of a mood stabilizer.  Talk therapy--Some forms of talk therapy are helpful in providing support, education, and guidance. A combination of medication and talk therapy is best for managing the disorder over time. A procedure in which electricity is applied to your brain through your scalp (electroconvulsive therapy) is used in cases of severe mania when medication and talk therapy do not work or work too slowly.   This information is not intended to replace advice given to you by your health care provider. Make sure you discuss any questions you have with your health care provider.   Document Released: 02/15/2001 Document Revised: 11/30/2014 Document Reviewed: 12/05/2012 Elsevier Interactive Patient Education [employer].

## 2016-02-18 NOTE — ED Notes (Addendum)
Per EMS, pt is a Runner, broadcasting/film/videoteacher.   Observed walking down hallway in a daze.  Pt has behavioral hx and has stopped her seroquel.  Pt stating she has a different name.  Pt initially told fire that she smoked weed this morning but denied in route.  Vitals: 178/ 114, hr 80, resp 22, 100% ra, cbg 100

## 2016-02-18 NOTE — BH Assessment (Signed)
Per Julieanne CottonJosephine, NP - patient meets criteria for OBS Unit.

## 2016-02-18 NOTE — ED Notes (Signed)
Heidi Baker with TTS called and said [facility] Lucianne [facility]ussKumar  Pt notified  Pt states she does not wish to go and wants to go home and follow up with her dr in the morning  EDP notified

## 2016-02-18 NOTE — ED Notes (Signed)
Patient called for triage x1 no answer.

## 2016-02-18 NOTE — Progress Notes (Signed)
Noted TTS staff request female visitor to step out of pt room until her telepsych process is completed  Female visitor offered a seat in the ED conference room but he refused and preferred to stand outside of pt's door against the wall

## 2016-03-08 IMAGING — CR DG CHEST 2V
2 series · 2 of 2 positions shown · non-contrast
Comparison: None.

CLINICAL DATA: Acute chest pain.

EXAM:
CHEST  2 VIEW

[w chest pa]
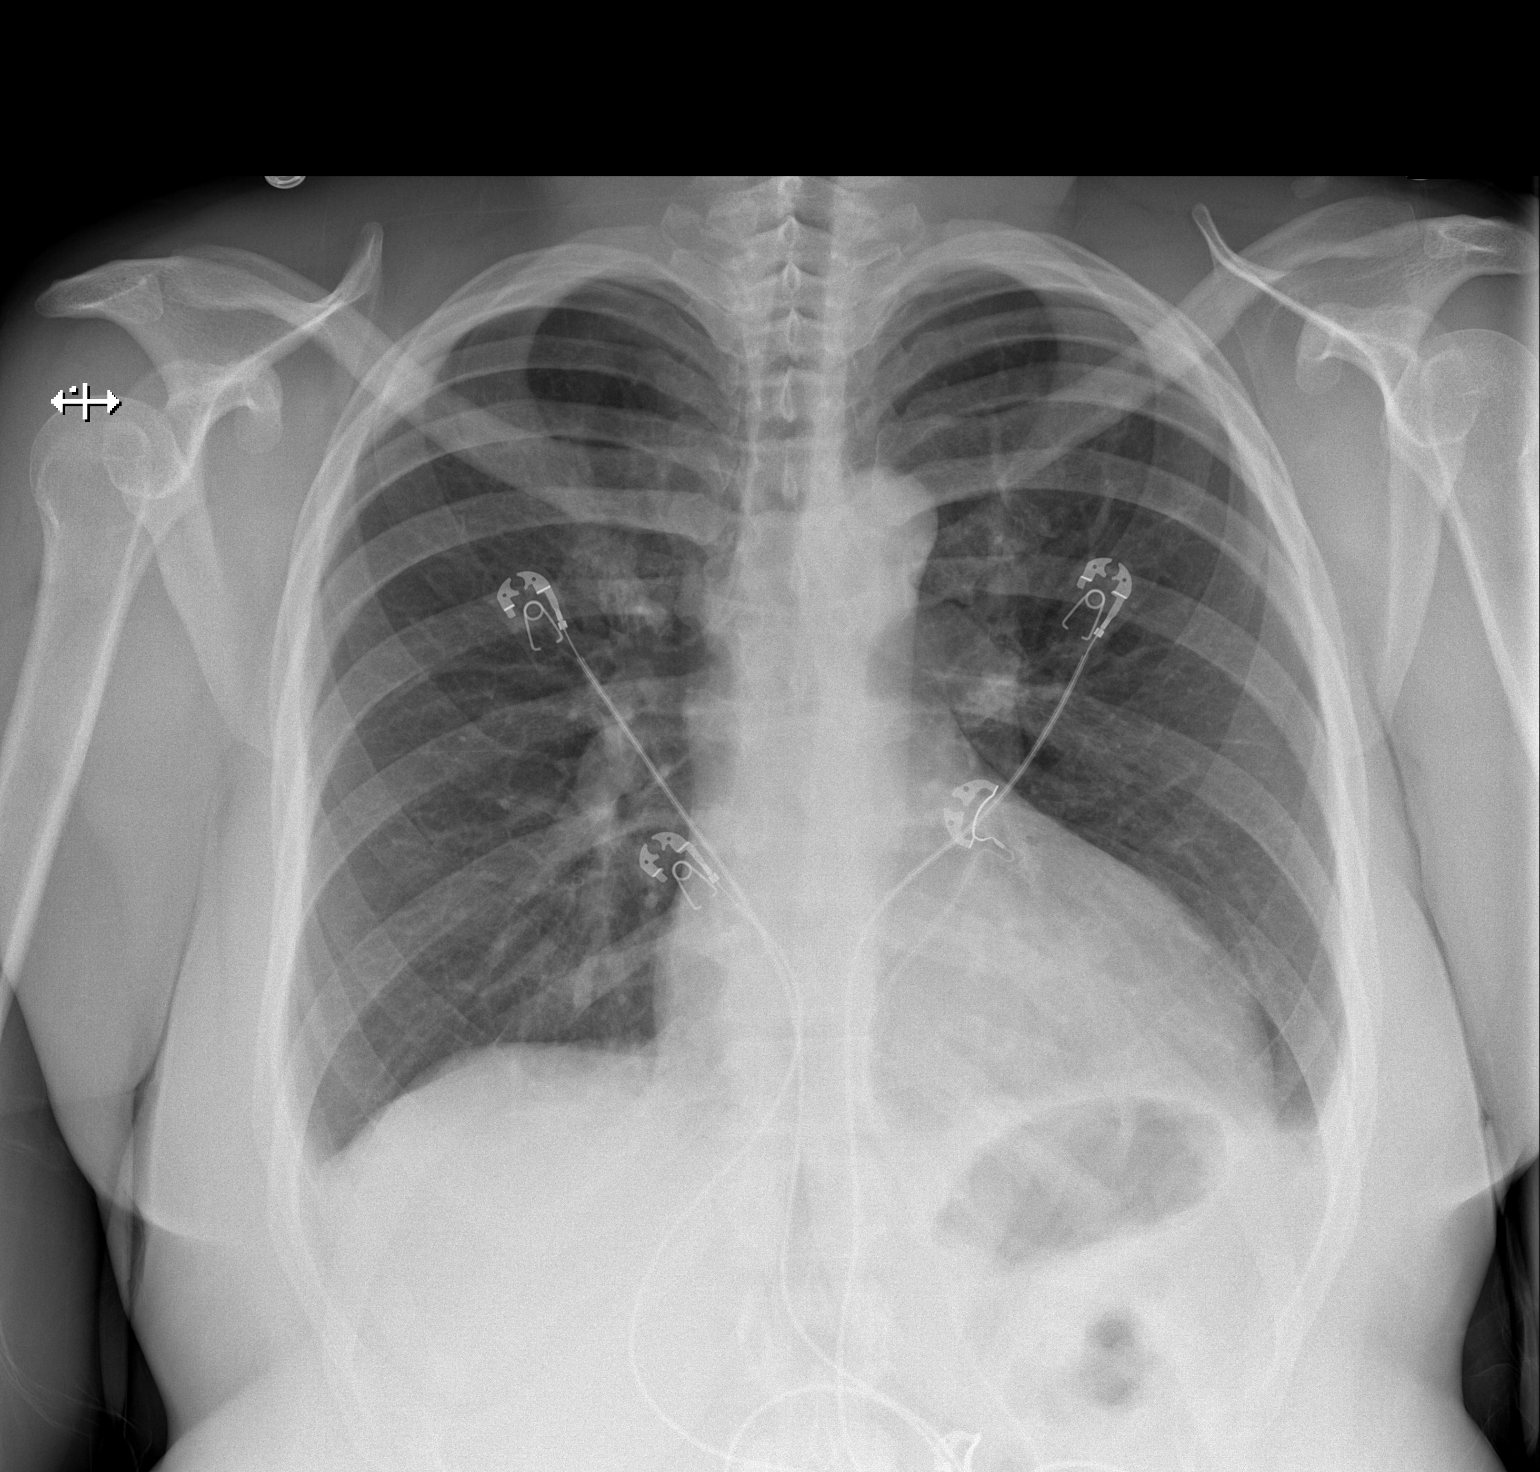

[w chest lat]
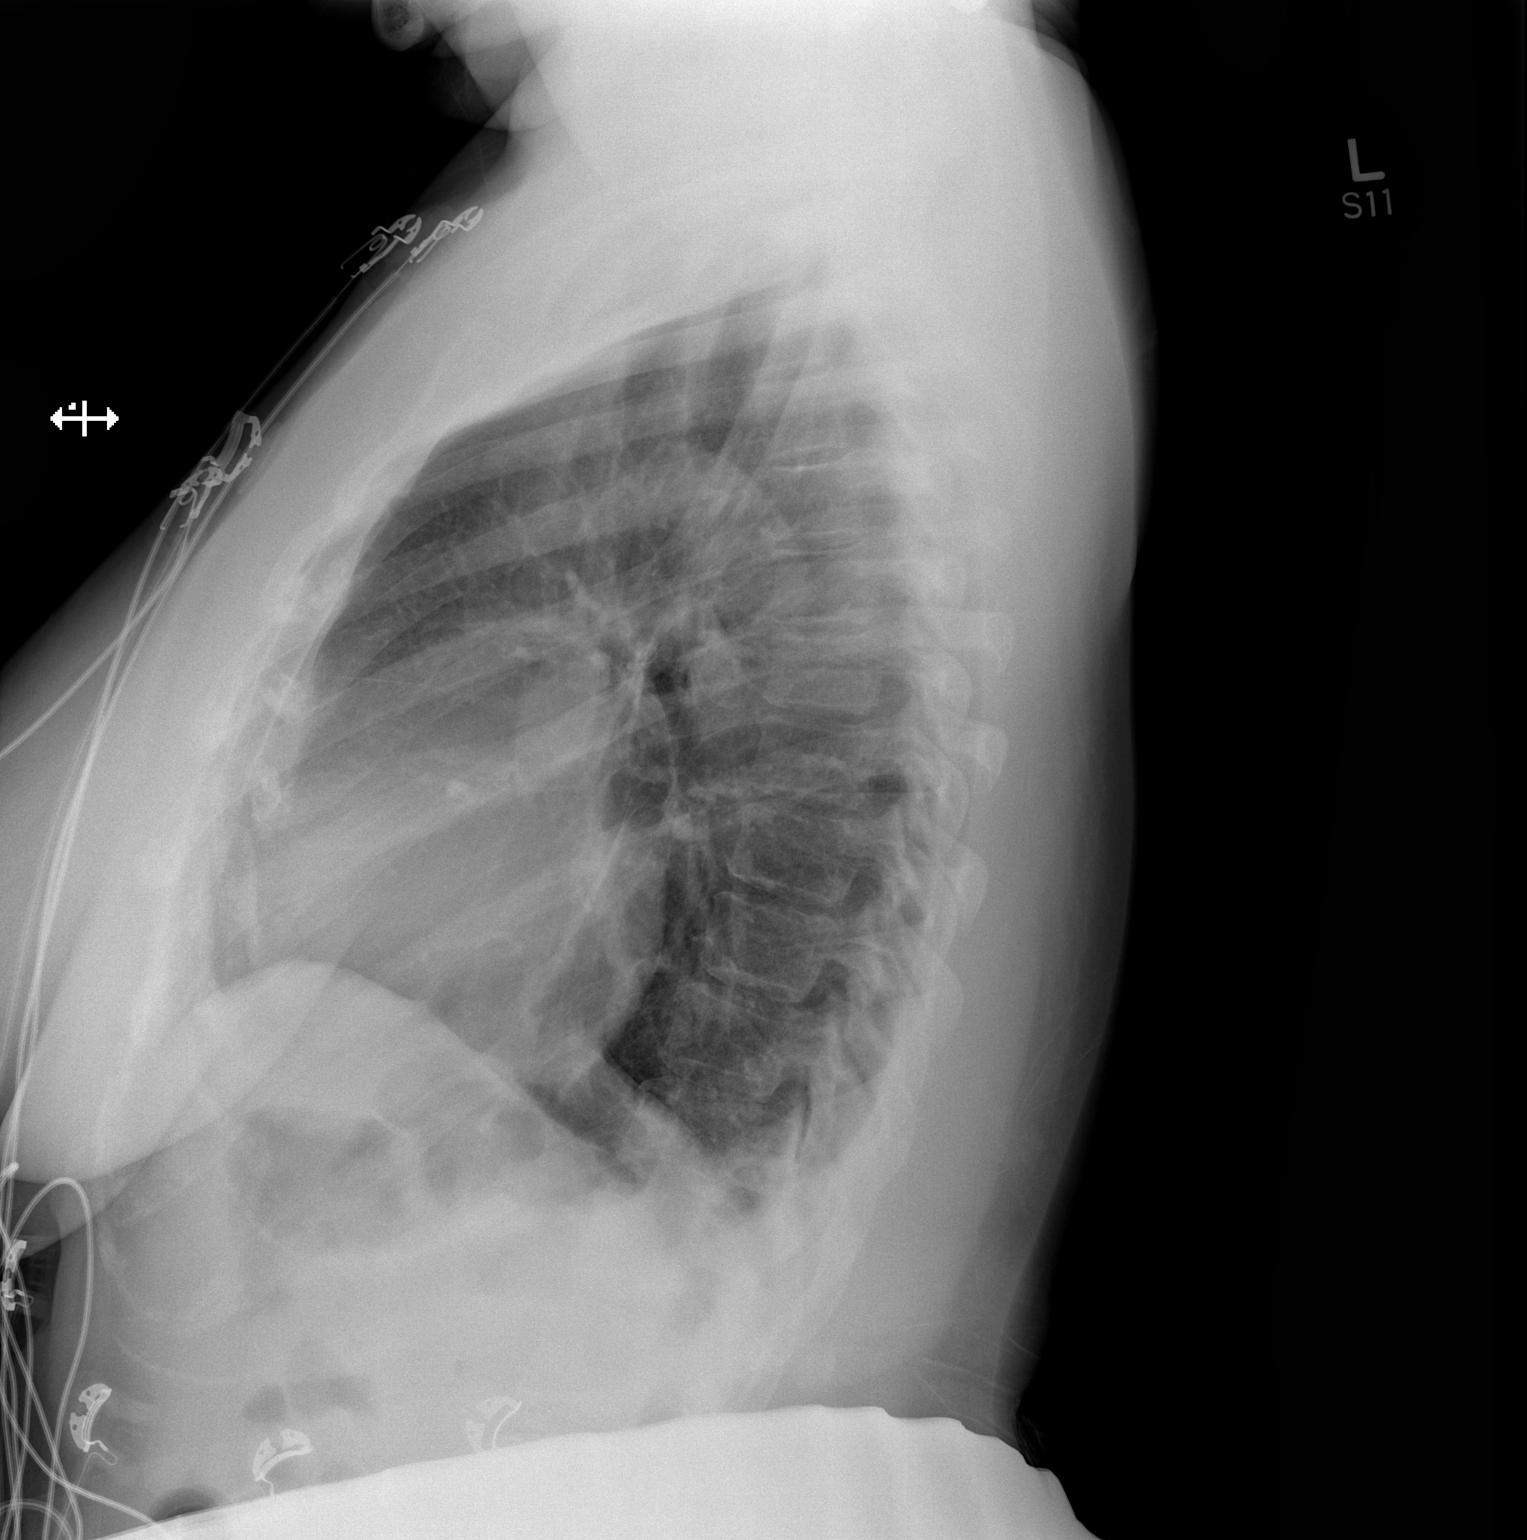

[2 of 2 positions shown; findings below may reference images not displayed]

FINDINGS: The heart size and mediastinal contours are within normal limits.
Both lungs are clear. No pneumothorax or significant pleural
effusion is noted. The visualized skeletal structures are
unremarkable.
IMPRESSION: No active cardiopulmonary disease.

## 2016-03-08 IMAGING — CT CT ANGIO HEAD
3 of 10 series · 10 of 36 positions shown · IV contrast (OMNIPAQUE 300)
Comparison: None.

CLINICAL DATA: Sudden onset headaches last night with blurry vision
and neck pain

EXAM:
CT ANGIOGRAPHY HEAD AND NECK
TECHNIQUE: Multidetector CT imaging of the head and neck was performed using
the standard protocol during bolus administration of intravenous
contrast. Multiplanar CT image reconstructions and MIPs were
obtained to evaluate the vascular anatomy. Carotid stenosis
measurements (when applicable) are obtained utilizing NASCET
criteria, using the distal internal carotid diameter as the
denominator.
CONTRAST:  100mL OMNIPAQUE IOHEXOL 350 MG/ML SOLN

[Series 7: head/neck · axial · 0.43mm/px · z∈[-337,-21]mm · 3 of 159 slices shown]
[im 1/159  soft-tissue]
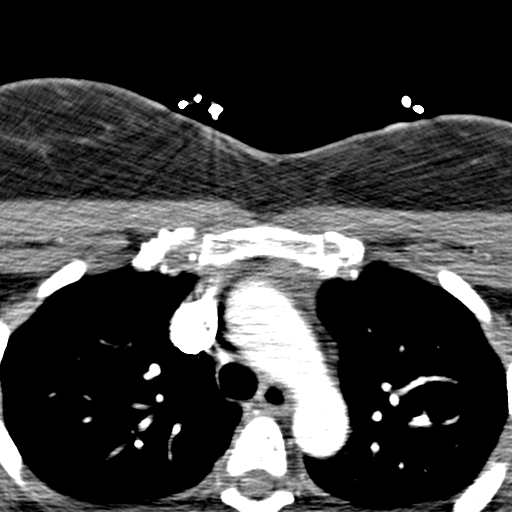
[im 80/159  bone]
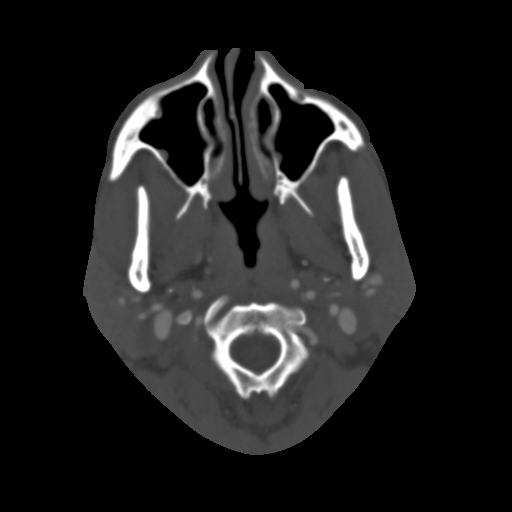
[im 159/159  soft-tissue]
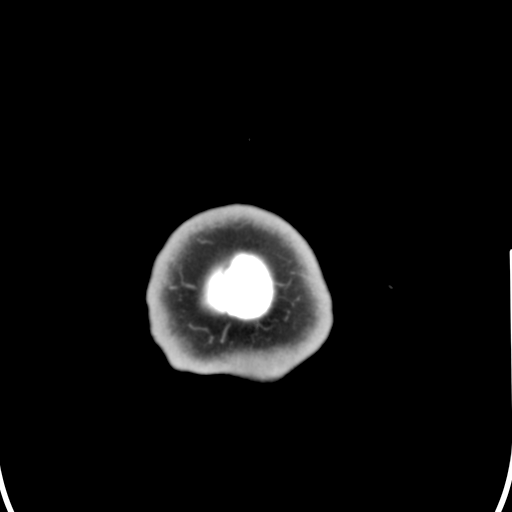

[Series 9: axial thin · axial · 0.39mm/px · z∈[-333,-138]mm · 5 of 313 slices shown]
[im 53/313  soft-tissue]
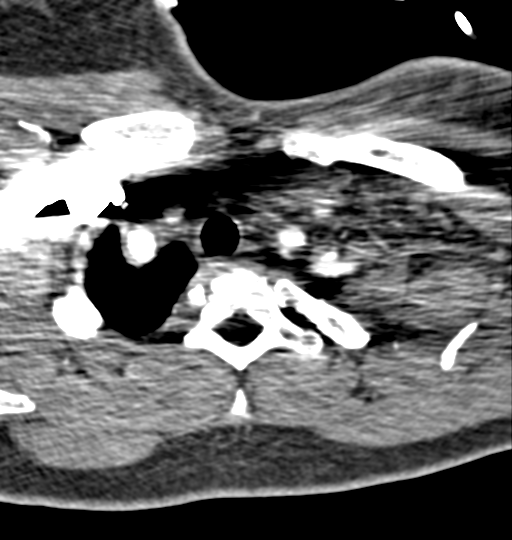
[im 105/313  soft-tissue]
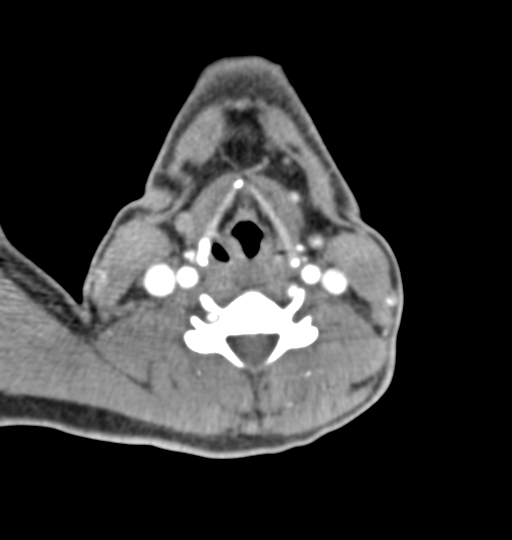
[im 157/313  soft-tissue]
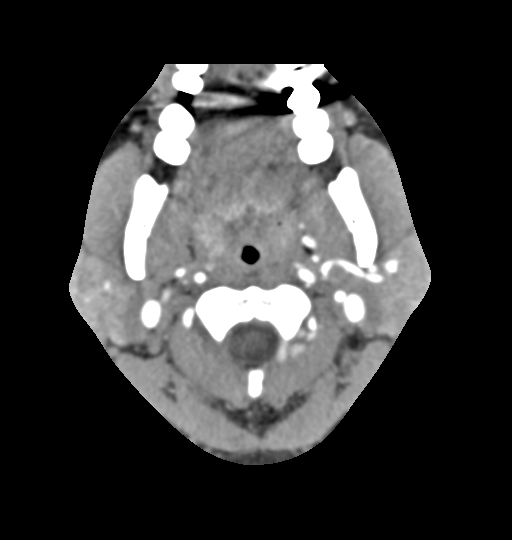
[im 209/313  soft-tissue]
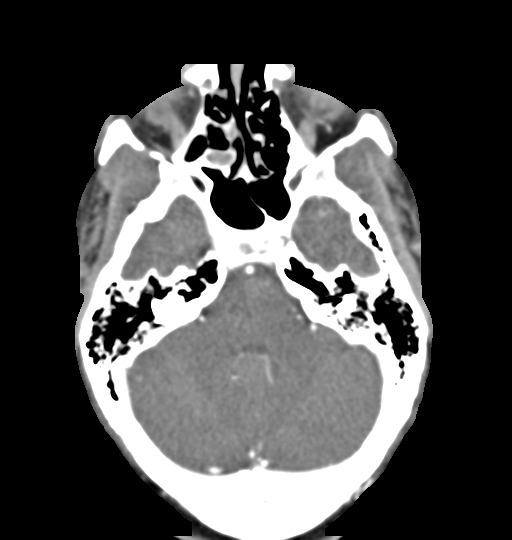
[im 261/313  soft-tissue]
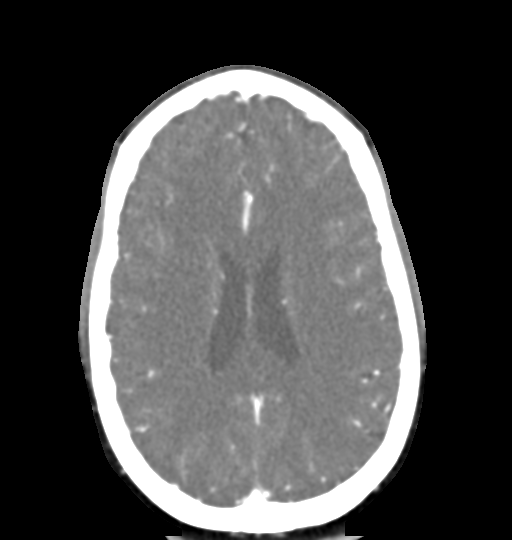

[Series 13: sagittal thin · sagittal · 0.51mm/px · 2 of 200 slices shown]
[im 48/200  soft-tissue]
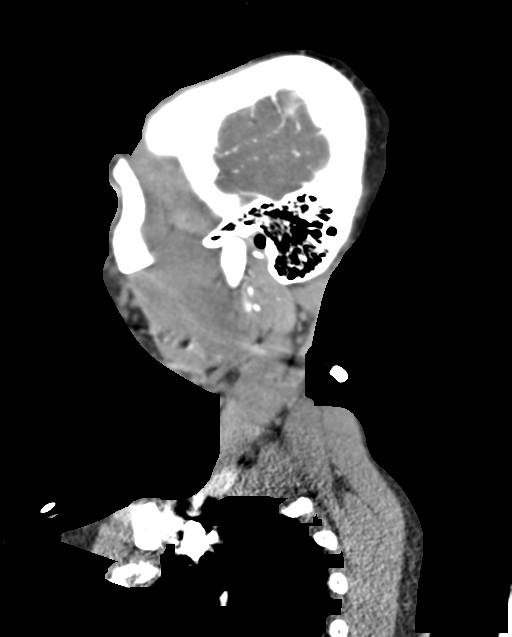
[im 153/200  soft-tissue]
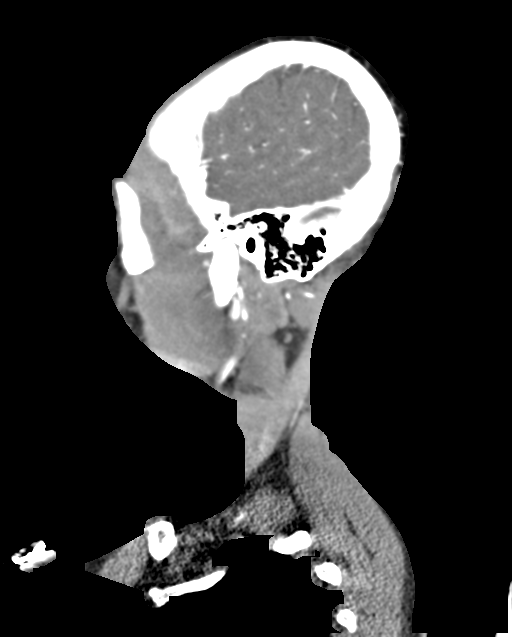

[10 of 36 positions shown; findings below may reference images not displayed]

FINDINGS: CT HEAD

Skull and Sinuses:Negative for fracture or destructive process.
Bilateral ethmoid opacification. No indication of acute sinusitis or
mastoiditis.

Orbits: No acute abnormality.

Brain: Unremarkable. No acute or remote infarction, hemorrhage,
hydrocephalus, or mass lesion/mass effect.

CTA NECK

Aortic arch: No visualized aneurysm or dissection. No inflammatory
wall thickening. Four vessel branching.

Right carotid system: Obscured proximal common carotid artery from
streak artifact due to intravenous contrast. Mild tortuosity of the
internal carotid artery. No atheromatous change, dissection, or
beading.

Left carotid system: Symmetric mild ICA tortuosity. No atheromatous
change, dissection, or beading.

Vertebral arteries:Mild right vertebral artery dominance. The left
vertebral artery arises from the aortic arch.

No stenosis or evidence of dissection.

Skeleton: No contributory finding.

Other neck: No incidental mass or adenopathy in the neck.

CTA HEAD

Limited by venous contamination

Anterior circulation: Small anterior and right posterior
communicating arteries are present.No major branch occlusion,
stenosis, or evidence of aneurysm/vascular malformation. No beading
to suggest vasculopathy.

Posterior circulation: Mild right vertebral artery dominance.
Symmetric vertebral and basilar branching. The proximal left PICA is
difficult to visualize on axial imaging where looping upward, but
appears continuous on reformats. No suspected flow limiting
stenosis. No evidence of vasculopathy, aneurysm, or vascular
malformation.

Venous sinuses:  Patent.

Anatomic variants: None significant

Delayed phase: No abnormal parenchymal enhancement
IMPRESSION: Negative CTA of the head and neck.
